# Patient Record
Sex: Female | Born: 1945 | Race: White | Hispanic: No | Smoking: Current every day smoker
Health system: Southern US, Community
[De-identification: ages and names within clinical notes are randomized; demographics above are authoritative.]

## PROBLEM LIST (undated history)

## (undated) DIAGNOSIS — Z9889 Other specified postprocedural states: Secondary | ICD-10-CM

## (undated) DIAGNOSIS — J449 Chronic obstructive pulmonary disease, unspecified: Secondary | ICD-10-CM

## (undated) DIAGNOSIS — K469 Unspecified abdominal hernia without obstruction or gangrene: Secondary | ICD-10-CM

## (undated) DIAGNOSIS — F419 Anxiety disorder, unspecified: Secondary | ICD-10-CM

## (undated) DIAGNOSIS — I10 Essential (primary) hypertension: Secondary | ICD-10-CM

## (undated) DIAGNOSIS — N3090 Cystitis, unspecified without hematuria: Secondary | ICD-10-CM

## (undated) DIAGNOSIS — N83209 Unspecified ovarian cyst, unspecified side: Secondary | ICD-10-CM

## (undated) DIAGNOSIS — E7800 Pure hypercholesterolemia, unspecified: Secondary | ICD-10-CM

## (undated) DIAGNOSIS — Z6791 Unspecified blood type, Rh negative: Secondary | ICD-10-CM

## (undated) DIAGNOSIS — K219 Gastro-esophageal reflux disease without esophagitis: Secondary | ICD-10-CM

## (undated) DIAGNOSIS — C50919 Malignant neoplasm of unspecified site of unspecified female breast: Secondary | ICD-10-CM

## (undated) DIAGNOSIS — N898 Other specified noninflammatory disorders of vagina: Secondary | ICD-10-CM

## (undated) DIAGNOSIS — Z8619 Personal history of other infectious and parasitic diseases: Secondary | ICD-10-CM

## (undated) DIAGNOSIS — R112 Nausea with vomiting, unspecified: Secondary | ICD-10-CM

## (undated) HISTORY — PX: TRIGGER FINGER RELEASE: SHX641

## (undated) HISTORY — DX: Cystitis, unspecified without hematuria: N30.90

## (undated) HISTORY — DX: Other specified noninflammatory disorders of vagina: N89.8

## (undated) HISTORY — DX: Essential (primary) hypertension: I10

## (undated) HISTORY — PX: NASAL SINUS SURGERY: SHX719

## (undated) HISTORY — DX: Pure hypercholesterolemia, unspecified: E78.00

## (undated) HISTORY — DX: Personal history of other infectious and parasitic diseases: Z86.19

## (undated) HISTORY — PX: EYE SURGERY: SHX253

## (undated) HISTORY — DX: Unspecified abdominal hernia without obstruction or gangrene: K46.9

## (undated) HISTORY — DX: Unspecified blood type, rh negative: Z67.91

## (undated) HISTORY — DX: Unspecified ovarian cyst, unspecified side: N83.209

## (undated) HISTORY — DX: Chronic obstructive pulmonary disease, unspecified: J44.9

## (undated) HISTORY — PX: HEMORRHOID SURGERY: SHX153

## (undated) HISTORY — DX: Malignant neoplasm of unspecified site of unspecified female breast: C50.919

## (undated) HISTORY — DX: Anxiety disorder, unspecified: F41.9

---

## 1998-05-17 ENCOUNTER — Other Ambulatory Visit: Admission: RE | Admit: 1998-05-17 | Discharge: 1998-05-17 | Payer: Self-pay | Admitting: Obstetrics and Gynecology

## 2004-04-21 ENCOUNTER — Other Ambulatory Visit: Admission: RE | Admit: 2004-04-21 | Discharge: 2004-04-21 | Payer: Self-pay | Admitting: Obstetrics and Gynecology

## 2004-05-16 ENCOUNTER — Encounter: Admission: RE | Admit: 2004-05-16 | Discharge: 2004-05-16 | Payer: Self-pay | Admitting: Obstetrics and Gynecology

## 2005-12-19 ENCOUNTER — Encounter: Admission: RE | Admit: 2005-12-19 | Discharge: 2005-12-19 | Payer: Self-pay | Admitting: Obstetrics and Gynecology

## 2007-05-29 ENCOUNTER — Encounter: Admission: RE | Admit: 2007-05-29 | Discharge: 2007-05-29 | Payer: Self-pay | Admitting: Obstetrics and Gynecology

## 2008-06-12 ENCOUNTER — Encounter: Admission: RE | Admit: 2008-06-12 | Discharge: 2008-06-12 | Payer: Self-pay | Admitting: Obstetrics and Gynecology

## 2008-06-18 ENCOUNTER — Encounter: Admission: RE | Admit: 2008-06-18 | Discharge: 2008-06-18 | Payer: Self-pay | Admitting: Obstetrics and Gynecology

## 2010-04-03 ENCOUNTER — Encounter: Payer: Self-pay | Admitting: Obstetrics and Gynecology

## 2010-07-14 ENCOUNTER — Other Ambulatory Visit: Payer: Self-pay | Admitting: Obstetrics and Gynecology

## 2010-07-14 DIAGNOSIS — Z1231 Encounter for screening mammogram for malignant neoplasm of breast: Secondary | ICD-10-CM

## 2010-07-25 ENCOUNTER — Ambulatory Visit
Admission: RE | Admit: 2010-07-25 | Discharge: 2010-07-25 | Disposition: A | Discharge status: Home / Self Care | Payer: Medicare Other | Source: Ambulatory Visit | Attending: Obstetrics and Gynecology | Admitting: Obstetrics and Gynecology

## 2010-07-25 DIAGNOSIS — Z1231 Encounter for screening mammogram for malignant neoplasm of breast: Secondary | ICD-10-CM

## 2011-06-27 ENCOUNTER — Other Ambulatory Visit: Payer: Self-pay | Admitting: Obstetrics and Gynecology

## 2011-06-27 DIAGNOSIS — Z1231 Encounter for screening mammogram for malignant neoplasm of breast: Secondary | ICD-10-CM

## 2011-07-27 ENCOUNTER — Ambulatory Visit: Payer: Medicare Other

## 2011-10-09 ENCOUNTER — Ambulatory Visit: Payer: Medicare Other

## 2011-10-13 ENCOUNTER — Ambulatory Visit: Payer: Medicare Other

## 2011-10-27 ENCOUNTER — Ambulatory Visit
Admission: RE | Admit: 2011-10-27 | Discharge: 2011-10-27 | Disposition: A | Discharge status: Home / Self Care | Payer: Medicare Other | Source: Ambulatory Visit | Attending: Obstetrics and Gynecology | Admitting: Obstetrics and Gynecology

## 2011-10-27 DIAGNOSIS — Z1231 Encounter for screening mammogram for malignant neoplasm of breast: Secondary | ICD-10-CM

## 2011-11-20 ENCOUNTER — Encounter: Payer: Self-pay | Admitting: Obstetrics and Gynecology

## 2011-11-20 ENCOUNTER — Other Ambulatory Visit: Payer: Medicare Other

## 2011-11-20 ENCOUNTER — Ambulatory Visit (INDEPENDENT_AMBULATORY_CARE_PROVIDER_SITE_OTHER): Payer: Medicare Other | Admitting: Obstetrics and Gynecology

## 2011-11-20 VITALS — BP 132/72 | Ht 63.5 in | Wt 133.0 lb

## 2011-11-20 DIAGNOSIS — Z124 Encounter for screening for malignant neoplasm of cervix: Secondary | ICD-10-CM

## 2011-11-20 DIAGNOSIS — Z13820 Encounter for screening for osteoporosis: Secondary | ICD-10-CM

## 2011-11-20 DIAGNOSIS — F419 Anxiety disorder, unspecified: Secondary | ICD-10-CM | POA: Insufficient documentation

## 2011-11-20 DIAGNOSIS — Z6791 Unspecified blood type, Rh negative: Secondary | ICD-10-CM | POA: Insufficient documentation

## 2011-11-20 DIAGNOSIS — N898 Other specified noninflammatory disorders of vagina: Secondary | ICD-10-CM | POA: Insufficient documentation

## 2011-11-20 DIAGNOSIS — K469 Unspecified abdominal hernia without obstruction or gangrene: Secondary | ICD-10-CM | POA: Insufficient documentation

## 2011-11-20 DIAGNOSIS — N3090 Cystitis, unspecified without hematuria: Secondary | ICD-10-CM | POA: Insufficient documentation

## 2011-11-20 DIAGNOSIS — Z01419 Encounter for gynecological examination (general) (routine) without abnormal findings: Secondary | ICD-10-CM

## 2011-11-20 DIAGNOSIS — M810 Age-related osteoporosis without current pathological fracture: Secondary | ICD-10-CM

## 2011-11-20 DIAGNOSIS — J449 Chronic obstructive pulmonary disease, unspecified: Secondary | ICD-10-CM | POA: Insufficient documentation

## 2011-11-20 DIAGNOSIS — E7800 Pure hypercholesterolemia, unspecified: Secondary | ICD-10-CM | POA: Insufficient documentation

## 2011-11-20 DIAGNOSIS — N83209 Unspecified ovarian cyst, unspecified side: Secondary | ICD-10-CM | POA: Insufficient documentation

## 2011-11-20 NOTE — Progress Notes (Signed)
The patient is not taking hormone replacement therapy The patient  is not taking a Calcium supplement. Post-menopausal bleeding:no  Last Pap: approximate date 12/31/2008 and was normal  Last mammogram: approximate date 10/27/2011 and was normal  Last DEXA scan : T= -2.3    2006 Last colonoscopy:n/a Has an upcoming appointment with Dr Kenna Gilbert partner  Urinary symptoms: none Normal bowel movements: No: constipation Reports abuse at home: No:   Subjective:    Amy Lucas is a 66 y.o. female G1P1 who presents for annual exam.  The patient has no complaints today.   The following portions of the patient's history were reviewed and updated as appropriate: allergies, current medications, past family history, past medical history, past social history, past surgical history and problem list.  Review of Systems Pertinent items are noted in HPI. Gastrointestinal:No change in bowel habits, no abdominal pain, no rectal bleeding Genitourinary:negative for dysuria, frequency, hematuria, nocturia and urinary incontinence    Objective:     Ht 5' 3.5" (1.613 m)  Wt 133 lb (60.328 kg)  BMI 23.19 kg/m2  Weight:  Wt Readings from Last 1 Encounters:  11/20/11 133 lb (60.328 kg)     BMI: Body mass index is 23.19 kg/(m^2). General Appearance: Alert, appropriate appearance for age. No acute distress HEENT: Grossly normal Neck / Thyroid: Supple, no masses, nodes or enlargement Lungs: clear to auscultation bilaterally Back: No CVA tenderness Breast Exam: No masses or nodes.No dimpling, nipple retraction or discharge. Cardiovascular: Regular rate and rhythm. S1, S2, no murmur Gastrointestinal: Soft, non-tender, no masses or organomegaly Pelvic Exam: Vulva and vagina appear normal. Bimanual exam reveals normal uterus and adnexa. Rectovaginal: normal rectal, no masses Lymphatic Exam: Non-palpable nodes in neck, clavicular, axillary, or inguinal regions Skin: no rash or abnormalities Neurologic:  Normal gait and speech, no tremor  Psychiatric: Alert and oriented, appropriate affect.     Assessment:    Normal gyn exam    Plan:   mammogram pap smear return annually or prn DEXA: osteoporosis. Pt to have Vitamin D level and follow-up visit to review options Follow-up:  for annual exam Calcium info given

## 2011-11-20 NOTE — Patient Instructions (Signed)
Patient Education Materials to be provided at check out (*indicates is located in Garment/textile technologist):  Menopause Years, *Osteoporosis, *Calcium Recommendations

## 2011-11-21 ENCOUNTER — Telehealth: Payer: Self-pay

## 2011-11-21 NOTE — Telephone Encounter (Signed)
LMTC at 11:00. Pt needs to return to office for Vit D level.  Also needs to f/u with SR for dx of osteoporosis.  ld

## 2011-11-22 LAB — PAP IG AND CT-NG NAA
Chlamydia Probe Amp: NEGATIVE
GC Probe Amp: NEGATIVE

## 2011-12-08 ENCOUNTER — Other Ambulatory Visit: Payer: Self-pay

## 2011-12-08 ENCOUNTER — Ambulatory Visit (INDEPENDENT_AMBULATORY_CARE_PROVIDER_SITE_OTHER): Payer: Medicare Other

## 2011-12-08 DIAGNOSIS — M810 Age-related osteoporosis without current pathological fracture: Secondary | ICD-10-CM

## 2012-09-05 ENCOUNTER — Other Ambulatory Visit: Payer: Self-pay

## 2012-09-05 DIAGNOSIS — Z1231 Encounter for screening mammogram for malignant neoplasm of breast: Secondary | ICD-10-CM

## 2012-10-28 ENCOUNTER — Ambulatory Visit: Payer: Medicare Other

## 2012-11-18 ENCOUNTER — Ambulatory Visit
Admission: RE | Admit: 2012-11-18 | Discharge: 2012-11-18 | Disposition: A | Discharge status: Home / Self Care | Payer: Medicare Other | Source: Ambulatory Visit

## 2012-11-18 DIAGNOSIS — Z1231 Encounter for screening mammogram for malignant neoplasm of breast: Secondary | ICD-10-CM

## 2012-11-20 ENCOUNTER — Other Ambulatory Visit: Payer: Self-pay | Admitting: Obstetrics and Gynecology

## 2012-11-20 DIAGNOSIS — R928 Other abnormal and inconclusive findings on diagnostic imaging of breast: Secondary | ICD-10-CM

## 2012-12-04 ENCOUNTER — Ambulatory Visit
Admission: RE | Admit: 2012-12-04 | Discharge: 2012-12-04 | Disposition: A | Discharge status: Home / Self Care | Payer: Medicare Other | Source: Ambulatory Visit | Attending: Obstetrics and Gynecology | Admitting: Obstetrics and Gynecology

## 2012-12-04 DIAGNOSIS — R928 Other abnormal and inconclusive findings on diagnostic imaging of breast: Secondary | ICD-10-CM

## 2013-09-25 ENCOUNTER — Other Ambulatory Visit: Payer: Self-pay | Admitting: Obstetrics and Gynecology

## 2013-09-25 ENCOUNTER — Other Ambulatory Visit: Payer: Self-pay

## 2013-09-25 DIAGNOSIS — Z1231 Encounter for screening mammogram for malignant neoplasm of breast: Secondary | ICD-10-CM

## 2013-11-20 ENCOUNTER — Ambulatory Visit: Payer: Medicare Other

## 2013-12-03 ENCOUNTER — Ambulatory Visit
Admission: RE | Admit: 2013-12-03 | Discharge: 2013-12-03 | Disposition: A | Discharge status: Home / Self Care | Payer: Medicare Other | Source: Ambulatory Visit | Attending: Obstetrics and Gynecology | Admitting: Obstetrics and Gynecology

## 2013-12-03 DIAGNOSIS — Z1231 Encounter for screening mammogram for malignant neoplasm of breast: Secondary | ICD-10-CM

## 2014-01-12 ENCOUNTER — Encounter: Payer: Self-pay | Admitting: Obstetrics and Gynecology

## 2015-04-30 DIAGNOSIS — E539 Vitamin B deficiency, unspecified: Secondary | ICD-10-CM | POA: Insufficient documentation

## 2015-04-30 DIAGNOSIS — F339 Major depressive disorder, recurrent, unspecified: Secondary | ICD-10-CM | POA: Insufficient documentation

## 2016-05-05 DIAGNOSIS — J3089 Other allergic rhinitis: Secondary | ICD-10-CM | POA: Insufficient documentation

## 2016-12-22 DIAGNOSIS — K219 Gastro-esophageal reflux disease without esophagitis: Secondary | ICD-10-CM | POA: Insufficient documentation

## 2017-03-15 ENCOUNTER — Ambulatory Visit: Payer: Self-pay | Admitting: Cardiology

## 2017-03-15 ENCOUNTER — Ambulatory Visit: Payer: Self-pay | Admitting: Cardiovascular Disease

## 2017-03-15 NOTE — Progress Notes (Deleted)
Cardiology Office Note   Date:  03/15/2017   ID:  Amy Lucas, DOB 1945-10-29, MRN [id number]  PCP:  Gordan Payment., MD  Cardiologist:   Chilton Si, MD   No chief complaint on file.     History of Present Illness: Amy Lucas is a 72 y.o. female with hyperlipidemia, tobacco abuse, and COPD, who presents for pre-surgical risk assessment. She saw Loann Quill, FNP on 12/31 for pre-op assessment prior to GYN surgery under anesthesia.  She has an endometrial polyp.  At that appointment she had an EKG that showed sinus tachycardia and PACs.  It was felt that she should be seen by cardiology prior to surgery.    Past Medical History:  Diagnosis Date  . Anxiety   . Bladder infection   . Blood type, Rh negative   . COPD (chronic obstructive pulmonary disease)   . Hernia   . High cholesterol   . History of chicken pox   . History of measles   . Ovarian cyst   . Vaginal dryness     Past Surgical History:  Procedure Laterality Date  . HEMORRHOID SURGERY       Current Outpatient Medications  Medication Sig Dispense Refill  . albuterol (PROAIR HFA) 108 (90 Base) MCG/ACT inhaler INHALE 2 PUFFS BY MOUTH EVERY 4 HOURS AS NEEDED    . ALPRAZolam (XANAX) 1 MG tablet Take 1 mg by mouth at bedtime as needed.    . AMBULATORY NON FORMULARY MEDICATION Medication Name: Cholesterol med. Pt unsure of name.    . diclofenac sodium (VOLTAREN) 1 % GEL Apply bid as needed to painful joints 1gm    . Fluticasone-Salmeterol (ADVAIR) 250-50 MCG/DOSE AEPB Inhale into the lungs.    . ibandronate (BONIVA) 150 MG tablet TK 1 T PO Q MONTH    . levofloxacin (LEVAQUIN) 750 MG tablet TK 1 T PO QD FOR 7 DAYS    . loratadine-pseudoephedrine (CLARITIN-D 24 HOUR) 10-240 MG 24 hr tablet TK 1 T PO QD    . omeprazole (PRILOSEC) 40 MG capsule TK 1 C PO 20 MIN B BRE    . polyethylene glycol powder (GLYCOLAX/MIRALAX) powder     . ranitidine (ZANTAC) 150 MG capsule Take 150 mg by mouth 2 (two) times daily.     . simvastatin (ZOCOR) 40 MG tablet TK 1 T PO QHS    . Wheat Dextrin (BENEFIBER PO) Take by mouth.     No current facility-administered medications for this visit.     Allergies:   Sulfa antibiotics and Triamcinolone acetonide    Social History:  The patient  reports that she has been smoking cigarettes.  She has been smoking about 1.00 pack per day. she has never used smokeless tobacco. She reports that she drinks alcohol. She reports that she does not use drugs.   Family History:  The patient's ***family history includes Cancer in her maternal aunt and maternal grandmother; Heart disease in her mother; Hypertension in her mother.    ROS:  Please see the history of present illness.   Otherwise, review of systems are positive for {NONE DEFAULTED:18576::"none"}.   All other systems are reviewed and negative.    PHYSICAL EXAM: VS:  There were no vitals taken for this visit. , BMI There is no height or weight on file to calculate BMI. GENERAL:  Well appearing HEENT:  Pupils equal round and reactive, fundi not visualized, oral mucosa unremarkable NECK:  No jugular venous distention, waveform within normal limits,  carotid upstroke brisk and symmetric, no bruits, no thyromegaly LYMPHATICS:  No cervical adenopathy LUNGS:  Clear to auscultation bilaterally HEART:  RRR.  PMI not displaced or sustained,S1 and S2 within normal limits, no S3, no S4, no clicks, no rubs, *** murmurs ABD:  Flat, positive bowel sounds normal in frequency in pitch, no bruits, no rebound, no guarding, no midline pulsatile mass, no hepatomegaly, no splenomegaly EXT:  2 plus pulses throughout, no edema, no cyanosis no clubbing SKIN:  No rashes no nodules NEURO:  Cranial nerves II through XII grossly intact, motor grossly intact throughout PSYCH:  Cognitively intact, oriented to person place and time    EKG:  EKG {ACTION; IS/IS QIO:96295284} ordered today. The ekg ordered today demonstrates ***   Recent Labs: No  results found for requested labs within last 8760 hours.    Lipid Panel No results found for: CHOL, TRIG, HDL, CHOLHDL, VLDL, LDLCALC, LDLDIRECT    Wt Readings from Last 3 Encounters:  11/20/11 133 lb (60.3 kg)      ASSESSMENT AND PLAN:  ***   Current medicines are reviewed at length with the patient today.  The patient {ACTIONS; HAS/DOES NOT HAVE:19233} concerns regarding medicines.  The following changes have been made:  {PLAN; NO CHANGE:13088:s}  Labs/ tests ordered today include: *** No orders of the defined types were placed in this encounter.    Disposition:   FU with ***    This note was written with the assistance of speech recognition software.  Please excuse any transcriptional errors.  Signed, Carlota Philley C. Duke Salvia, MD, [facility]  03/15/2017 1:05 PM    [facility]

## 2017-03-19 ENCOUNTER — Ambulatory Visit: Payer: Self-pay | Admitting: Cardiology

## 2017-04-02 ENCOUNTER — Ambulatory Visit: Payer: Medicare Other | Admitting: Cardiology

## 2017-04-02 ENCOUNTER — Encounter (INDEPENDENT_AMBULATORY_CARE_PROVIDER_SITE_OTHER): Payer: Self-pay

## 2017-04-02 ENCOUNTER — Encounter: Payer: Self-pay | Admitting: Cardiology

## 2017-04-02 VITALS — BP 120/74 | HR 114 | Ht 62.0 in | Wt 143.0 lb

## 2017-04-02 DIAGNOSIS — E7800 Pure hypercholesterolemia, unspecified: Secondary | ICD-10-CM | POA: Diagnosis not present

## 2017-04-02 DIAGNOSIS — F419 Anxiety disorder, unspecified: Secondary | ICD-10-CM | POA: Diagnosis not present

## 2017-04-02 DIAGNOSIS — J431 Panlobular emphysema: Secondary | ICD-10-CM | POA: Diagnosis not present

## 2017-04-02 DIAGNOSIS — I471 Supraventricular tachycardia: Secondary | ICD-10-CM | POA: Insufficient documentation

## 2017-04-02 NOTE — Patient Instructions (Signed)
Medication Instructions:  Your physician recommends that you continue on your current medications as directed. Please refer to the Current Medication list given to you today.  Labwork: None ordered  Testing/Procedures: Your physician has requested that you have an echocardiogram. Echocardiography is a painless test that uses sound waves to create images of your heart. It provides your doctor with information about the size and shape of your heart and how well your heart's chambers and valves are working. This procedure takes approximately one hour. There are no restrictions for this procedure.  Your physician has recommended that you wear a holter monitor. Holter monitors are medical devices that record the heart's electrical activity. Doctors most often use these monitors to diagnose arrhythmias. Arrhythmias are problems with the speed or rhythm of the heartbeat. The monitor is a small, portable device. You can wear one while you do your normal daily activities. This is usually used to diagnose what is causing palpitations/syncope (passing out). You will wear this for 24 hours. Be prepared to return to the office to have it taken off.  Follow-Up: Your physician recommends that you schedule a follow-up appointment in: 1 month with Dr. Bing MatterKrasowski  Any Other Special Instructions Will Be Listed Below (If Applicable).     If you need a refill on your cardiac medications before your next appointment, please call your pharmacy.

## 2017-04-02 NOTE — Progress Notes (Signed)
Cardiology Consultation:    Date:  04/02/2017   ID:  Amy Lucas, DOB January 23, 1946, MRN [id number]  PCP:  Gordan Payment., MD  Cardiologist:  Gypsy Balsam, MD   Referring MD: Gordan Payment., MD   Chief Complaint  Patient presents with  . Pre-op Exam  . Abnormal ECG  I need surgery  History of Present Illness:    Amy Lucas is a 72 y.o. female who is being seen today for the evaluation of supraventricular tachycardia at the request of Gordan Payment., MD.  She is a 72 years old woman who is a chronic smoker required gynecological intervention.  I understand that that will be done off under sedation with Diprivan.  She did have a EKG done as part of her routine preop evaluation she was find to have some runs of supraventricular tachycardia she was referred to [place] for evaluation.  Amy Lucas is completely unaware of it.  Denies have any palpitations dizziness syncope chest pain tightness squeezing pressure burning chest.  She is very energetic she works she has no difficulty doing it.  There is no swelling of lower extremities no proximal nocturnal dyspnea.   Past Medical History:  Diagnosis Date  . Anxiety   . Bladder infection   . Blood type, Rh negative   . COPD (chronic obstructive pulmonary disease) (HCC)   . Hernia   . High cholesterol   . History of chicken pox   . History of measles   . Ovarian cyst   . Vaginal dryness     Past Surgical History:  Procedure Laterality Date  . HEMORRHOID SURGERY      Current Medications: Current Meds  Medication Sig  . albuterol (PROAIR HFA) 108 (90 Base) MCG/ACT inhaler INHALE 2 PUFFS BY MOUTH EVERY 4 HOURS AS NEEDED  . ALPRAZolam (XANAX) 1 MG tablet Take 1 mg by mouth at bedtime as needed.  . diclofenac sodium (VOLTAREN) 1 % GEL Apply bid as needed to painful joints 1gm  . Fluticasone-Salmeterol (ADVAIR) 250-50 MCG/DOSE AEPB Inhale into the lungs.  . ibandronate (BONIVA) 150 MG tablet TK 1 T PO Q MONTH  .  loratadine-pseudoephedrine (CLARITIN-D 24 HOUR) 10-240 MG 24 hr tablet TK 1 T PO QD  . omeprazole (PRILOSEC) 40 MG capsule TK 1 C PO 20 MIN B BRE  . polyethylene glycol powder (GLYCOLAX/MIRALAX) powder   . simvastatin (ZOCOR) 40 MG tablet TK 1 T PO QHS     Allergies:   Sulfa antibiotics and Triamcinolone acetonide   Social History   Socioeconomic History  . Marital status: Married    Spouse name: None  . Number of children: None  . Years of education: None  . Highest education level: None  Social Needs  . Financial resource strain: None  . Food insecurity - worry: None  . Food insecurity - inability: None  . Transportation needs - medical: None  . Transportation needs - non-medical: None  Occupational History  . None  Tobacco Use  . Smoking status: Current Every Day Smoker    Packs/day: 1.00    Types: Cigarettes  . Smokeless tobacco: Never Used  Substance and Sexual Activity  . Alcohol use: Yes  . Drug use: No  . Sexual activity: Yes    Birth control/protection: Post-menopausal  Other Topics Concern  . None  Social History Narrative  . None     Family History: The patient's family history includes Cancer in her maternal aunt and maternal grandmother; Heart disease in  her mother; Heart failure in her mother; Hypertension in her mother. ROS:   Please see the history of present illness.    All 14 point review of systems negative except as described per history of present illness.  EKGs/Labs/Other Studies Reviewed:    The following studies were reviewed today: KG from primary care office showed normal sinus rhythm normal P interval no microscopic duration morphology there is a round of SVT at rate of 160: No ST segment changes    Recent Labs: No results found for requested labs within last 8760 hours.  Recent Lipid Panel No results found for: CHOL, TRIG, HDL, CHOLHDL, VLDL, LDLCALC, LDLDIRECT  Physical Exam:    VS:  BP 120/74   Pulse (!) 114   Ht 5\' 2"  (1.575  m)   Wt 143 lb (64.9 kg)   SpO2 98%   BMI 26.16 kg/m     Wt Readings from Last 3 Encounters:  04/02/17 143 lb (64.9 kg)  11/20/11 133 lb (60.3 kg)     GEN:  Well nourished, well developed in no acute distress HEENT: Normal NECK: No JVD; No carotid bruits LYMPHATICS: No lymphadenopathy CARDIAC: RRR, no murmurs, no rubs, no gallops RESPIRATORY:  Clear to auscultation without rales, wheezing or rhonchi  ABDOMEN: Soft, non-tender, non-distended MUSCULOSKELETAL:  No edema; No deformity  SKIN: Warm and dry NEUROLOGIC:  Alert and oriented x 3 PSYCHIATRIC:  Normal affect   ASSESSMENT:    1. Panlobular emphysema (HCC)   2. Anxiety   3. High cholesterol    PLAN:    In order of problems listed above:  1. Preop evaluation for this lady with COPD and abnormal EKG.  Overall her surgery is considered low risk.  From cardiac standpoint reviewed the anesthesia that she will have is also considered low risk.  The fact that she has supraventricular tachycardia need to be investigated.  I will ask her to wear Holter monitor to see how much arrhythmia she does have.  I will also ask her to have an echocardiogram to assess her left ventricular ejection fraction.  Since she does have quite advanced COPD echocardiogram will also help to assess right ventricle size and function.  I will be also interested in pulmonary pressure. 2. High cholesterol: I do not see any results of her cholesterol within last year we will do the test. 3. Smoking: We spent at least 5 minutes talking about it and I strongly recommended her to quit.  She understands she will try 4. Diet: Noted.  LAD for a minor gynecological intervention EKG showing some supraventricular tachycardia we will do some basic evaluation but honestly I do not anticipate this to interfere with the surgery that she is going to have.  She may require small dose of beta-blocker and surgical time to suppress this arrhythmia if she develops  one.   Medication Adjustments/Labs and Tests Ordered: Current medicines are reviewed at length with the patient today.  Concerns regarding medicines are outlined above.  No orders of the defined types were placed in this encounter.  No orders of the defined types were placed in this encounter.   Signed, Georgeanna Lea, MD, [facility]. 04/02/2017 2:46 PM    [facility]

## 2017-04-03 ENCOUNTER — Ambulatory Visit: Payer: Medicare Other

## 2017-04-03 DIAGNOSIS — I480 Paroxysmal atrial fibrillation: Secondary | ICD-10-CM | POA: Diagnosis not present

## 2017-04-04 ENCOUNTER — Ambulatory Visit (HOSPITAL_COMMUNITY): Payer: Medicare Other | Attending: Cardiovascular Disease

## 2017-04-04 ENCOUNTER — Other Ambulatory Visit: Payer: Self-pay

## 2017-04-04 DIAGNOSIS — I5189 Other ill-defined heart diseases: Secondary | ICD-10-CM | POA: Insufficient documentation

## 2017-04-04 DIAGNOSIS — E7800 Pure hypercholesterolemia, unspecified: Secondary | ICD-10-CM

## 2017-04-04 DIAGNOSIS — J431 Panlobular emphysema: Secondary | ICD-10-CM | POA: Diagnosis not present

## 2017-04-04 DIAGNOSIS — R9431 Abnormal electrocardiogram [ECG] [EKG]: Secondary | ICD-10-CM | POA: Insufficient documentation

## 2017-04-04 DIAGNOSIS — Z01810 Encounter for preprocedural cardiovascular examination: Secondary | ICD-10-CM | POA: Insufficient documentation

## 2017-04-04 DIAGNOSIS — F419 Anxiety disorder, unspecified: Secondary | ICD-10-CM

## 2017-04-06 ENCOUNTER — Telehealth: Payer: Self-pay | Admitting: Cardiology

## 2017-04-06 NOTE — Telephone Encounter (Signed)
Wants echo and monitor results

## 2017-04-09 NOTE — Telephone Encounter (Signed)
Echo looks fine, holter - APC's, will talk about it during next visit

## 2017-04-10 ENCOUNTER — Telehealth: Payer: Self-pay | Admitting: Cardiology

## 2017-04-10 NOTE — Telephone Encounter (Signed)
Patient informed of results of holter monitor. No further questions.

## 2017-04-10 NOTE — Telephone Encounter (Signed)
Returned your call.

## 2017-04-11 NOTE — Telephone Encounter (Signed)
Patient has been advised of results.  

## 2017-05-03 ENCOUNTER — Ambulatory Visit: Payer: Medicare Other | Admitting: Cardiology

## 2017-05-03 ENCOUNTER — Encounter: Payer: Self-pay | Admitting: Cardiology

## 2017-05-03 VITALS — BP 154/86 | HR 104 | Ht 62.0 in | Wt 144.0 lb

## 2017-05-03 DIAGNOSIS — I471 Supraventricular tachycardia: Secondary | ICD-10-CM | POA: Diagnosis not present

## 2017-05-03 DIAGNOSIS — J431 Panlobular emphysema: Secondary | ICD-10-CM | POA: Diagnosis not present

## 2017-05-03 DIAGNOSIS — R0789 Other chest pain: Secondary | ICD-10-CM | POA: Diagnosis not present

## 2017-05-03 NOTE — Progress Notes (Signed)
Cardiology Office Note:    Date:  05/03/2017   ID:  Amy Lucas, DOB 12/18/1945, MRN [id number]  PCP:  Gordan Payment., MD  Cardiologist:  Gypsy Balsam, MD    Referring MD: Gordan Payment., MD   Chief Complaint  Patient presents with  . Follow-up  I had a chest pain this morning  History of Present Illness:    Amy Lucas is a 72 y.o. female with COPD as well as supraventricular tachycardia.  I saw her first time for evaluation of her tachyarrhythmia so far and her workup has been negative.  She did have gynecological surgery which was done under the prevent.  She did well.  This morning she woke up and she had pain in her neck going towards right shoulder and also going to the lower back she also had difficulty breathing she had to sit for a few minutes and use her inhaler to help her breathe and after that about 4 hours later pain subsided.  Now she is doing well.  Denies having any pain in her calves.  I will ask you to have troponin I as well as d-dimer done today.  Past Medical History:  Diagnosis Date  . Anxiety   . Bladder infection   . Blood type, Rh negative   . COPD (chronic obstructive pulmonary disease) (HCC)   . Hernia   . High cholesterol   . History of chicken pox   . History of measles   . Ovarian cyst   . Vaginal dryness     Past Surgical History:  Procedure Laterality Date  . HEMORRHOID SURGERY      Current Medications: Current Meds  Medication Sig  . albuterol (PROAIR HFA) 108 (90 Base) MCG/ACT inhaler INHALE 2 PUFFS BY MOUTH EVERY 4 HOURS AS NEEDED  . ALPRAZolam (XANAX) 1 MG tablet Take 1 mg by mouth at bedtime as needed for anxiety. Take 1-4 tablets daily as needed  . diclofenac sodium (VOLTAREN) 1 % GEL Apply bid as needed to painful joints 1gm  . Fluticasone-Salmeterol (ADVAIR) 250-50 MCG/DOSE AEPB Inhale into the lungs.  . ibandronate (BONIVA) 150 MG tablet TK 1 T PO Q MONTH  . loratadine-pseudoephedrine (CLARITIN-D 24 HOUR) 10-240 MG 24  hr tablet TK 1 T PO QD  . omeprazole (PRILOSEC) 40 MG capsule TK 1 C PO 20 MIN B BRE  . polyethylene glycol powder (GLYCOLAX/MIRALAX) powder   . simvastatin (ZOCOR) 40 MG tablet TK 1 T PO QHS     Allergies:   Sulfa antibiotics and Triamcinolone acetonide   Social History   Socioeconomic History  . Marital status: Married    Spouse name: None  . Number of children: None  . Years of education: None  . Highest education level: None  Social Needs  . Financial resource strain: None  . Food insecurity - worry: None  . Food insecurity - inability: None  . Transportation needs - medical: None  . Transportation needs - non-medical: None  Occupational History  . None  Tobacco Use  . Smoking status: Current Every Day Smoker    Packs/day: 1.00    Types: Cigarettes  . Smokeless tobacco: Never Used  Substance and Sexual Activity  . Alcohol use: Yes  . Drug use: No  . Sexual activity: Yes    Birth control/protection: Post-menopausal  Other Topics Concern  . None  Social History Narrative  . None     Family History: The patient's family history includes Cancer in her maternal  aunt and maternal grandmother; Heart disease in her mother; Heart failure in her mother; Hypertension in her mother. ROS:   Please see the history of present illness.    All 14 point review of systems negative except as described per history of present illness  EKGs/Labs/Other Studies Reviewed:      Recent Labs: No results found for requested labs within last 8760 hours.  Recent Lipid Panel No results found for: CHOL, TRIG, HDL, CHOLHDL, VLDL, LDLCALC, LDLDIRECT  Physical Exam:    VS:  BP (!) 154/86 (BP Location: Right Arm, Patient Position: Sitting, Cuff Size: Normal)   Pulse (!) 104   Ht 5\' 2"  (1.575 m)   Wt 144 lb (65.3 kg)   SpO2 96%   BMI 26.34 kg/m     Wt Readings from Last 3 Encounters:  05/03/17 144 lb (65.3 kg)  04/02/17 143 lb (64.9 kg)  11/20/11 133 lb (60.3 kg)     GEN:  Well  nourished, well developed in no acute distress HEENT: Normal NECK: No JVD; No carotid bruits LYMPHATICS: No lymphadenopathy CARDIAC: RRR, no murmurs, no rubs, no gallops RESPIRATORY:  Clear to auscultation without rales, wheezing or rhonchi  ABDOMEN: Soft, non-tender, non-distended MUSCULOSKELETAL:  No edema; No deformity  SKIN: Warm and dry LOWER EXTREMITIES: no swelling NEUROLOGIC:  Alert and oriented x 3 PSYCHIATRIC:  Normal affect   ASSESSMENT:    1. Supraventricular tachycardia (HCC)   2. Panlobular emphysema (HCC)    PLAN:    In order of problems listed above:  1. Supraventricular tachycardia.  Denies having any palpitations I am reluctant to put her on beta-blocker because of her advanced COPD.  She still continues to smoke but much less than before.  We spent a great deal of time talking about needs to quit she understands that this is would need to be done. 2. Chest pain: D-dimer will be done as well as troponin I her EKG today show no acute changes. 3. COPD: Followed by internal medicine team.   Medication Adjustments/Labs and Tests Ordered: Current medicines are reviewed at length with the patient today.  Concerns regarding medicines are outlined above.  No orders of the defined types were placed in this encounter.  Medication changes: No orders of the defined types were placed in this encounter.   Signed, Georgeanna Lea, MD, [facility] 05/03/2017 4:37 PM    [facility]

## 2017-05-03 NOTE — Patient Instructions (Signed)
Medication Instructions:  Your physician recommends that you continue on your current medications as directed. Please refer to the Current Medication list given to you today.  Labwork: Your physician recommends that you have lab work have: Troponin and D-Dimer  Testing/Procedures: None ordered  Follow-Up: Your physician recommends that you schedule a follow-up appointment in: 6 months with [place]. [place]   Any Other Special Instructions Will Be Listed Below (If Applicable).     If you need a refill on your cardiac medications before your next appointment, please call your pharmacy.

## 2017-05-04 LAB — TROPONIN I: Troponin I: <0.01 ng/mL (ref 0.00–0.04)

## 2017-05-04 LAB — D-DIMER, QUANTITATIVE: D-DIMER: 0.43 mg/L FEU (ref 0.00–0.49)

## 2017-05-07 ENCOUNTER — Telehealth: Payer: Self-pay | Admitting: Cardiology

## 2017-05-07 NOTE — Telephone Encounter (Signed)
Pt notified of lab results per Dr Krasowski.  Pt verbalized understanding. 

## 2017-05-07 NOTE — Telephone Encounter (Signed)
Patient would like her results please

## 2018-09-21 ENCOUNTER — Other Ambulatory Visit: Payer: Self-pay

## 2018-09-21 ENCOUNTER — Emergency Department (HOSPITAL_COMMUNITY)
Admission: EM | Admit: 2018-09-21 | Discharge: 2018-09-21 | Disposition: A | Discharge status: Home / Self Care | Payer: Medicare Other | Attending: Emergency Medicine | Admitting: Emergency Medicine

## 2018-09-21 ENCOUNTER — Encounter (HOSPITAL_COMMUNITY): Payer: Self-pay | Admitting: *Deleted

## 2018-09-21 ENCOUNTER — Emergency Department (HOSPITAL_COMMUNITY): Payer: Medicare Other

## 2018-09-21 DIAGNOSIS — I471 Supraventricular tachycardia: Secondary | ICD-10-CM | POA: Diagnosis not present

## 2018-09-21 DIAGNOSIS — Z79899 Other long term (current) drug therapy: Secondary | ICD-10-CM | POA: Insufficient documentation

## 2018-09-21 DIAGNOSIS — R42 Dizziness and giddiness: Secondary | ICD-10-CM | POA: Diagnosis present

## 2018-09-21 DIAGNOSIS — F17210 Nicotine dependence, cigarettes, uncomplicated: Secondary | ICD-10-CM | POA: Diagnosis not present

## 2018-09-21 DIAGNOSIS — J449 Chronic obstructive pulmonary disease, unspecified: Secondary | ICD-10-CM | POA: Diagnosis not present

## 2018-09-21 DIAGNOSIS — R110 Nausea: Secondary | ICD-10-CM | POA: Diagnosis not present

## 2018-09-21 LAB — CBC
HCT: 45.4 % (ref 36.0–46.0)
Hemoglobin: 14.6 g/dL (ref 12.0–15.0)
MCH: 30.1 pg (ref 26.0–34.0)
MCHC: 32.2 g/dL (ref 30.0–36.0)
MCV: 93.6 fL (ref 80.0–100.0)
Platelets: 213 10*3/uL (ref 150–400)
RBC: 4.85 MIL/uL (ref 3.87–5.11)
RDW: 12.8 % (ref 11.5–15.5)
WBC: 8.9 10*3/uL (ref 4.0–10.5)
nRBC: 0 % (ref 0.0–0.2)

## 2018-09-21 LAB — COMPREHENSIVE METABOLIC PANEL
ALT: 12 U/L (ref 0–44)
AST: 15 U/L (ref 15–41)
Albumin: 3.9 g/dL (ref 3.5–5.0)
Alkaline Phosphatase: 47 U/L (ref 38–126)
Anion gap: 11 (ref 5–15)
BUN: 12 mg/dL (ref 8–23)
CO2: 22 mmol/L (ref 22–32)
Calcium: 9.5 mg/dL (ref 8.9–10.3)
Chloride: 106 mmol/L (ref 98–111)
Creatinine, Ser: 0.84 mg/dL (ref 0.44–1.00)
GFR calc Af Amer: >60 mL/min (ref >60)
GFR calc non Af Amer: >60 mL/min (ref >60)
Glucose, Bld: 170 mg/dL — ABNORMAL HIGH (ref 70–99)
Potassium: 3.3 mmol/L — ABNORMAL LOW (ref 3.5–5.1)
Sodium: 139 mmol/L (ref 135–145)
Total Bilirubin: 0.5 mg/dL (ref 0.3–1.2)
Total Protein: 7.4 g/dL (ref 6.5–8.1)

## 2018-09-21 LAB — DIFFERENTIAL
Abs Immature Granulocytes: 0.02 10*3/uL (ref 0.00–0.07)
Basophils Absolute: 0 10*3/uL (ref 0.0–0.1)
Basophils Relative: 0 %
Eosinophils Absolute: 0.1 10*3/uL (ref 0.0–0.5)
Eosinophils Relative: 1 %
Immature Granulocytes: 0 %
Lymphocytes Relative: 27 %
Lymphs Abs: 2.4 10*3/uL (ref 0.7–4.0)
Monocytes Absolute: 0.5 10*3/uL (ref 0.1–1.0)
Monocytes Relative: 6 %
Neutro Abs: 5.9 10*3/uL (ref 1.7–7.7)
Neutrophils Relative %: 66 %

## 2018-09-21 LAB — PROTIME-INR
INR: 1.1 (ref 0.8–1.2)
Prothrombin Time: 13.7 seconds (ref 11.4–15.2)

## 2018-09-21 LAB — CBG MONITORING, ED: Glucose-Capillary: 77 mg/dL (ref 70–99)

## 2018-09-21 LAB — I-STAT CHEM 8, ED
BUN: 13 mg/dL (ref 8–23)
Calcium, Ion: 1.19 mmol/L (ref 1.15–1.40)
Chloride: 105 mmol/L (ref 98–111)
Creatinine, Ser: 0.8 mg/dL (ref 0.44–1.00)
Glucose, Bld: 166 mg/dL — ABNORMAL HIGH (ref 70–99)
HCT: 45 % (ref 36.0–46.0)
Hemoglobin: 15.3 g/dL — ABNORMAL HIGH (ref 12.0–15.0)
Potassium: 3.2 mmol/L — ABNORMAL LOW (ref 3.5–5.1)
Sodium: 140 mmol/L (ref 135–145)
TCO2: 24 mmol/L (ref 22–32)

## 2018-09-21 LAB — APTT: aPTT: 31 seconds (ref 24–36)

## 2018-09-21 MED ORDER — MECLIZINE HCL 25 MG PO TABS: 25.0000 mg | ORAL_TABLET | Freq: Three times a day (TID) | ORAL | 0 refills | Status: AC | PRN

## 2018-09-21 MED ORDER — MECLIZINE HCL 25 MG PO TABS
25.0000 mg | ORAL_TABLET | Freq: Once | ORAL | Status: AC
  Administered 2018-09-21: 25 mg via ORAL
  Filled 2018-09-21: qty 1

## 2018-09-21 MED ORDER — SODIUM CHLORIDE 0.9% FLUSH: 3.0000 mL | Freq: Once | INTRAVENOUS | Status: DC

## 2018-09-21 NOTE — ED Provider Notes (Signed)
[facility]   CSN: [id number] Arrival date & time: 09/21/18  1851     History   Chief Complaint Chief Complaint  Patient presents with   Dizziness    HPI Amy Lucas is a 73 y.o. female.     Patient is a 73 year old female with a history of COPD, high cholesterol and SVT who is presenting today with a complaint of dizziness.  Patient states that last night she fell asleep on the couch and when she got up to try to go to bed she felt very dizzy like the room was spinning and she started falling but she was able to catch herself.  She states she has had several episodes since last night and the room will spin and it makes her feel terrible.  It is related to certain movements mostly when she turns to her right or moves her head to the right.  When these episodes occur she feels weak all over but denies any loss of consciousness, feelings of lightheadedness, palpitations or chest pain.  She has had no headache, neck pain or visual changes last night or today.  No unilateral weakness numbness, facial drooping or speech difficulty.  She denies having symptoms at this moment but states when she was in the CAT scanner and when she turns her her left to get back onto the bed she started having symptoms again.  However they are now resolved  The history is provided by the patient.  Dizziness Quality:  Head spinning Severity:  Moderate Onset quality:  Sudden Duration:  1 day Timing:  Intermittent Progression:  Resolved Chronicity:  New Context comment:  Started last night when she stood up from the cough.   Relieved by:  Being still Worsened by:  Movement Ineffective treatments:  None tried Associated symptoms: nausea   Associated symptoms: no chest pain, no diarrhea, no headaches, no hearing loss, no vision changes and no vomiting   Associated symptoms comment:  When episodes happen she feels weak all over for a few minutes but  improves if she keeps walking Risk factors: no hx of vertigo and no new medications     Past Medical History:  Diagnosis Date   Anxiety    Bladder infection    Blood type, Rh negative    COPD (chronic obstructive pulmonary disease) (HCC)    Hernia    High cholesterol    History of chicken pox    History of measles    Ovarian cyst    Vaginal dryness     Patient Active Problem List   Diagnosis Date Noted   Atypical chest pain 05/03/2017   Supraventricular tachycardia (HCC) 04/02/2017   GERD (gastroesophageal reflux disease) 12/22/2016   Chronic nonseasonal allergic rhinitis due to pollen 05/05/2016   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 Authorizing Provider  albuterol (PROAIR HFA) 108 (90 Base) MCG/ACT inhaler INHALE 2 PUFFS BY MOUTH EVERY 4 HOURS AS NEEDED 03/02/17   [provider]  ALPRAZolam Prudy Feeler(XANAX) 1 MG tablet Take 1 mg by mouth at bedtime as needed for anxiety. Take 1-4 tablets daily as needed    [provider]  diclofenac sodium (VOLTAREN) 1 % GEL Apply bid as needed to painful joints 1gm 05/05/16   [provider]  Fluticasone-Salmeterol (ADVAIR) 250-50 MCG/DOSE AEPB Inhale into the lungs.    [provider]  ibandronate (BONIVA) 150 MG tablet TK 1 T PO Q MONTH 02/18/17   [provider]  loratadine-pseudoephedrine  (CLARITIN-D 24 HOUR) 10-240 MG 24 hr tablet TK 1 T PO QD 03/02/17   [provider]  omeprazole (PRILOSEC) 40 MG capsule TK 1 C PO 20 MIN B BRE 02/11/17   [provider]  polyethylene glycol powder (GLYCOLAX/MIRALAX) powder  12/23/16   [provider]  simvastatin (ZOCOR) 40 MG tablet TK 1 T PO QHS 02/12/17   [provider]    Family History Family History  Problem Relation Age of Onset   Cancer Maternal Grandmother    Heart disease Mother    Hypertension Mother    Heart failure Mother    Cancer Maternal Aunt     Social History Social History   Tobacco Use   Smoking status: Current Every Day Smoker    Packs/day: 1.00    Types: Cigarettes   Smokeless tobacco: Never Used  Substance Use Topics   Alcohol use: Yes   Drug use: No     Allergies   Sulfa antibiotics and Triamcinolone acetonide   Review of Systems Review of Systems  HENT: Negative for hearing loss.   Cardiovascular: Negative for chest pain.  Gastrointestinal: Positive for nausea. Negative for diarrhea and vomiting.  Neurological: Positive for dizziness. Negative for headaches.  All other systems reviewed and are negative.    Physical Exam Updated Vital Signs BP (!) 147/80 (BP Location: Right Arm)    Pulse 84    Temp 98.4 F (36.9 C) (Oral)    Resp 18    SpO2 96%   Physical Exam Vitals signs and nursing note reviewed.  Constitutional:      General: She is not in acute distress.    Appearance: She is well-developed.  HENT:     Head: Normocephalic and atraumatic.  Eyes:     General: No visual field deficit.    Pupils: Pupils are equal, round, and reactive to light.  Cardiovascular:     Rate and Rhythm: Normal rate and regular rhythm.     Heart sounds: Normal heart sounds. No murmur. No friction rub.  Pulmonary:     Effort: Pulmonary effort is normal.     Breath sounds: Normal breath sounds. No wheezing or rales.  Abdominal:     General: Bowel sounds are  normal. There is no distension.     Palpations: Abdomen is soft.     Tenderness: There is no abdominal tenderness. There is no guarding or rebound.  Musculoskeletal: Normal range of motion.        General: No tenderness.     Comments:  No edema  Skin:    General: Skin is warm and dry.     Findings: No rash.  Neurological:     General: No focal deficit present.     Mental Status: She is alert and oriented to person, place, and time. Mental status is at baseline.     Cranial Nerves: No cranial nerve deficit or facial asymmetry.     Sensory: Sensation is intact. No sensory deficit.     Motor: No weakness or pronator drift.     Coordination: Coordination normal. Finger-Nose-Finger Test and Heel to [place] Test normal.     Gait: Gait is intact.     Comments: No notable nystagmus  Psychiatric:        Behavior: Behavior normal.      ED Treatments / Results  Labs (all labs ordered are listed, but only abnormal results are displayed) Labs Reviewed  COMPREHENSIVE METABOLIC PANEL - Abnormal; Notable for the following components:      Result Value   Potassium 3.3 (*)    Glucose, Bld 170 (*)    All other components within normal limits  I-STAT CHEM 8, ED - Abnormal; Notable for the following components:   Potassium 3.2 (*)    Glucose, Bld 166 (*)    Hemoglobin 15.3 (*)    All other components within normal limits  PROTIME-INR  APTT  CBC  DIFFERENTIAL  CBG MONITORING, ED    EKG None  Radiology Ct Head Wo Contrast  Result Date: 09/21/2018 CLINICAL DATA:  Fall, dizzy spells EXAM: CT HEAD WITHOUT CONTRAST TECHNIQUE: Contiguous axial images were obtained from the base of the skull through the vertex without intravenous contrast. COMPARISON:  None. FINDINGS: Brain: No evidence of acute infarction, hemorrhage, hydrocephalus, extra-axial collection or mass lesion/mass effect. Subcortical white matter and periventricular small vessel ischemic changes. Vascular: No hyperdense vessel or  unexpected calcification. Skull: Normal. Negative for fracture or focal lesion. Sinuses/Orbits: Mild layering fluid in the right sphenoid sinus. Visualized paranasal sinuses and mastoid air cells are otherwise clear. Other: None. IMPRESSION: No evidence of acute intracranial abnormality. Mild small vessel ischemic changes. Electronically Signed   By: Julian Hy M.D.   On: 09/21/2018 20:29    Procedures Procedures (including critical care time)  Medications Ordered in ED Medications  sodium chloride flush (NS) 0.9 % injection 3 mL (has no administration in time range)  meclizine (ANTIVERT) tablet 25 mg (has no administration in time range)     Initial Impression / Assessment and Plan / ED Course  I have reviewed the triage vital signs and the nursing notes.  Pertinent labs & imaging results that were available during my care of the patient were reviewed by me and considered in my medical decision making (see chart for details).        Pt with sx most consistent with peripheral vertigo.  No systemic or infectious sx.  Normal neuro exam without weakness, ataxia or cerebellar findings on exam.  Normal vision.  Sx are reproducible with movement of the head and attempting to walk.  No hx of Stroke and low likelihood.  Normal VS.  Labs and CT done prior to being seen and CT neg for acute issues and labs with minimal hypokalemia of 3.2 but o/w labs wnl.  Patient is having no headache or neck pain suggestive of intracranial hemorrhage, subarachnoid hemorrhage, vertebral dissection.  Do not feel that patient needs an MRI at this time.  Will treat for peripheral vertigo and  Final Clinical Impressions(s) / ED Diagnoses   Final diagnoses:  Vertigo    ED Discharge Orders         Ordered    meclizine (ANTIVERT) 25 MG tablet  3 times daily PRN     09/21/18 2229           Gwyneth SproutPlunkett, Dereon Williamsen, MD 09/21/18 2229

## 2018-09-21 NOTE — ED Notes (Signed)
Patient successfully completed swallow screen with water. No choking/coughing noted.

## 2018-09-21 NOTE — ED Triage Notes (Signed)
Pt sent from UC for ongoing dizziness and a CT scan. Pt reports last night she was sitting on the couch and felt "like the room was wavy then I leaned to my R side." pt has had two other episodes that caused her daughter in law to bring her to be seen. 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 No neuro deficits at present

## 2018-09-21 NOTE — ED Notes (Signed)
Patient verbalizes understanding of discharge instructions. Opportunity for questioning and answers were provided. pt discharged from ED with family. Patient educated on new prescription as well.

## 2018-09-21 NOTE — Discharge Instructions (Signed)
You can take the meclizine as needed for the dizziness.  Make sure you are moving slowly and get up slowly and make sure you have something to hold onto so that you do not lose your balance.

## 2019-12-16 IMAGING — CT CT HEAD WITHOUT CONTRAST
4 series · 17 of 47 positions shown, 19 images · non-contrast
Comparison: None.

CLINICAL DATA: Fall, dizzy spells

EXAM:
CT HEAD WITHOUT CONTRAST
TECHNIQUE: Contiguous axial images were obtained from the base of the skull
through the vertex without intravenous contrast.

[Series 3: head wo · axial · 0.44mm/px · z∈[-154,-30]mm · 7 of 35 slices shown, 9 images]
[im 5/35  brain]
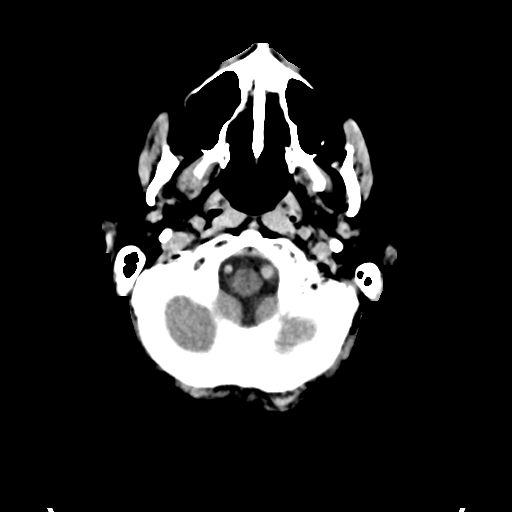
[im 5/35  bone]
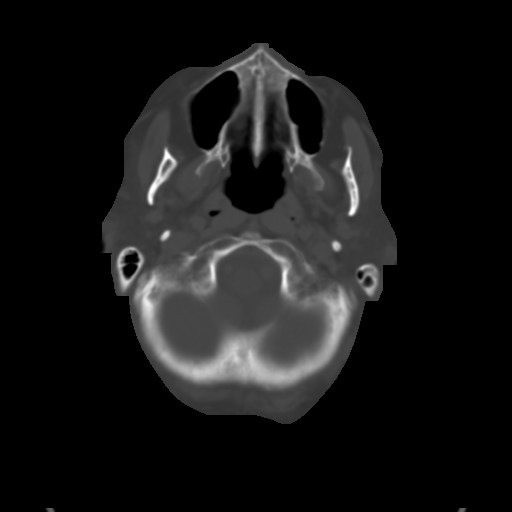
[im 9/35  brain]
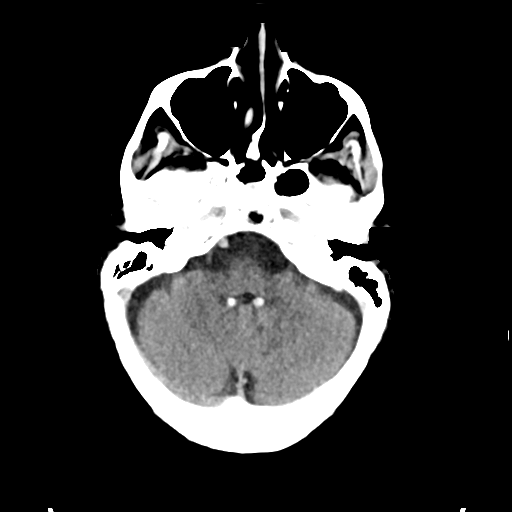
[im 13/35  brain]
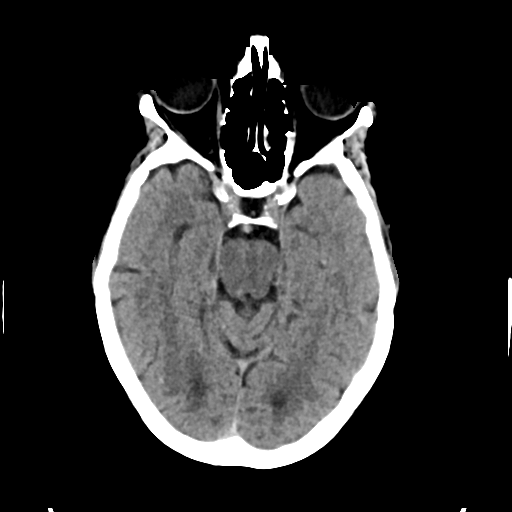
[im 18/35  brain]
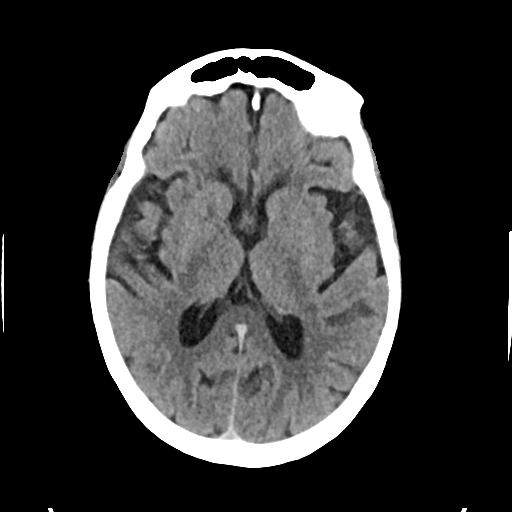
[im 22/35  brain]
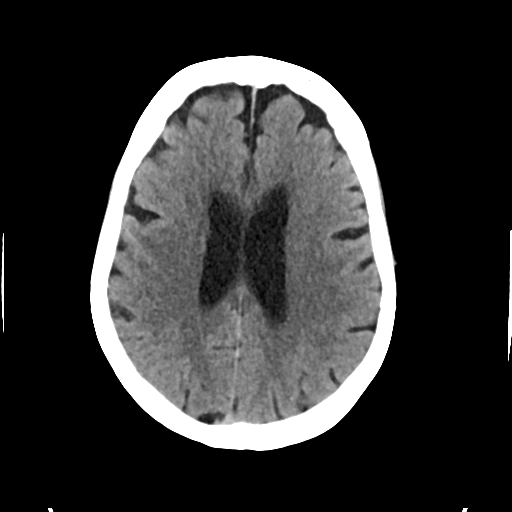
[im 22/35  bone]
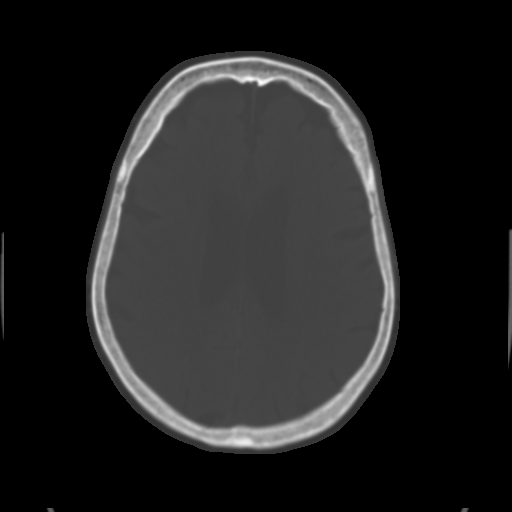
[im 26/35  brain]
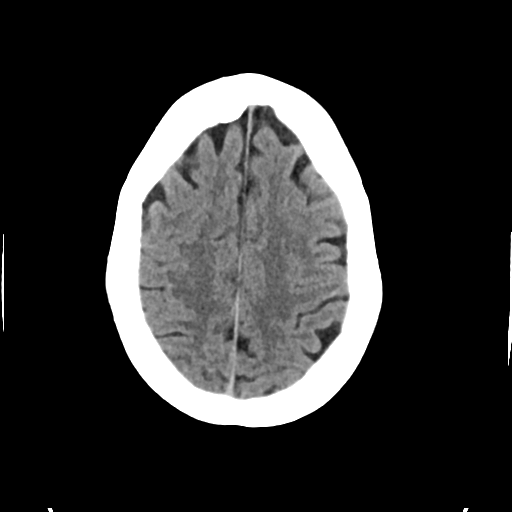
[im 30/35  brain]
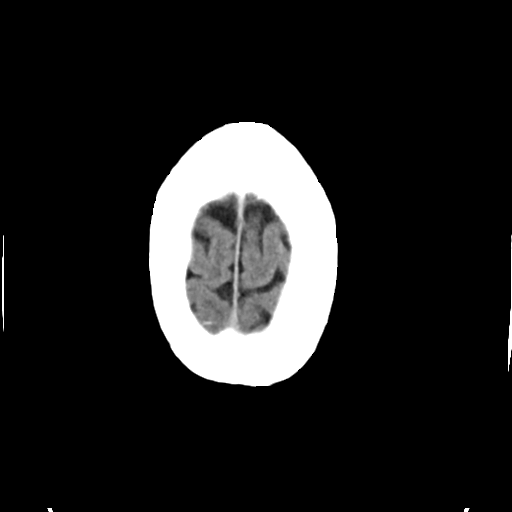

[Series 4: head bone · axial · 0.44mm/px · z∈[-158,-98]mm · 4 of 86 slices shown]
[im 9/86  bone]
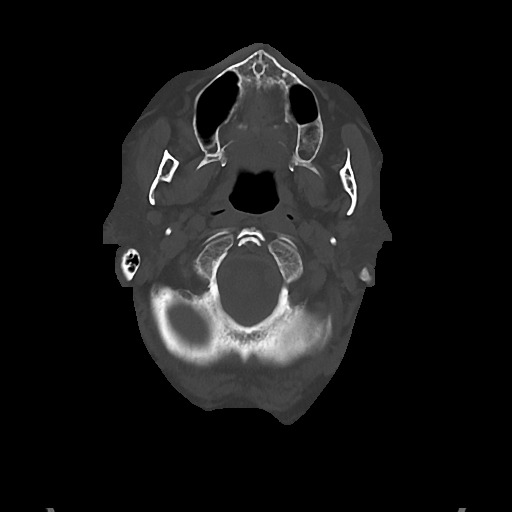
[im 18/86  bone]
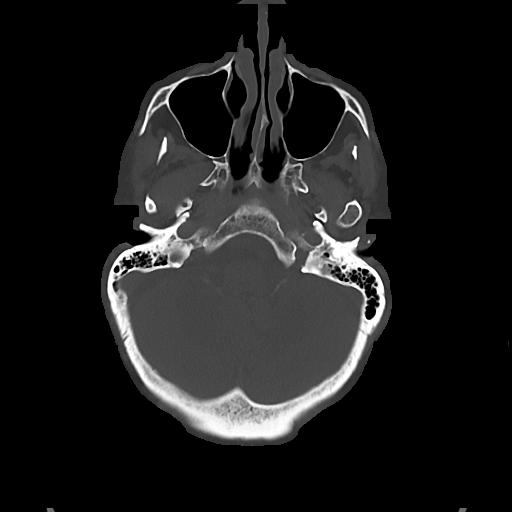
[im 26/86  bone]
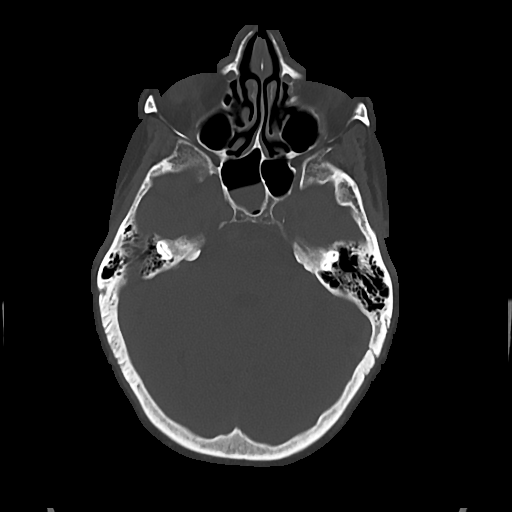
[im 39/86  bone]
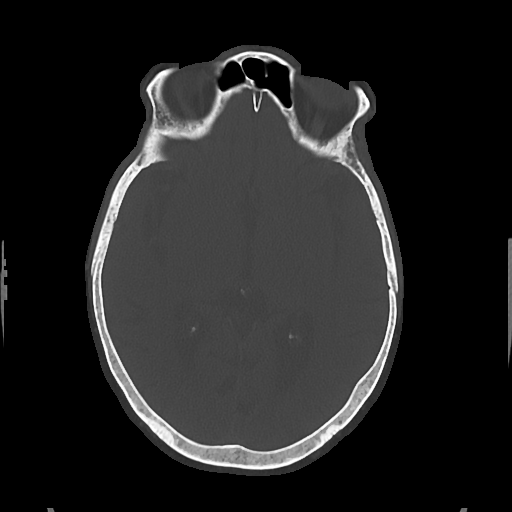

[Series 5: cor soft · coronal · 0.33mm/px · 3 of 73 slices shown]
[im 25/73  brain]
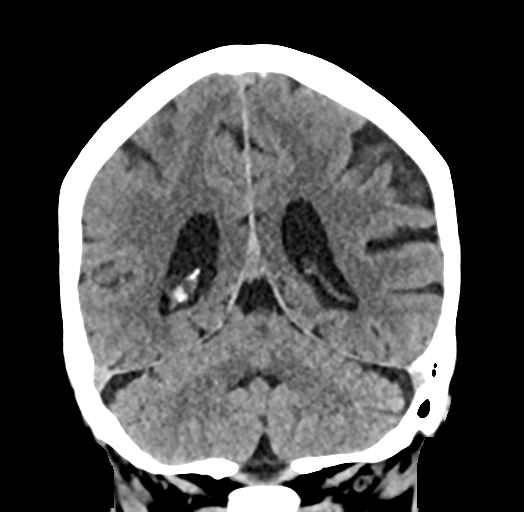
[im 33/73  brain]
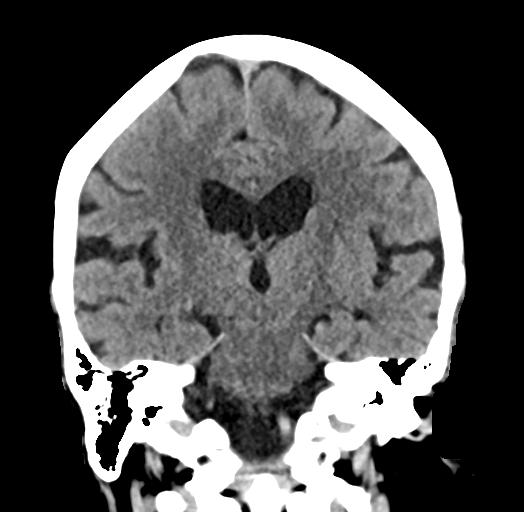
[im 41/73  brain]
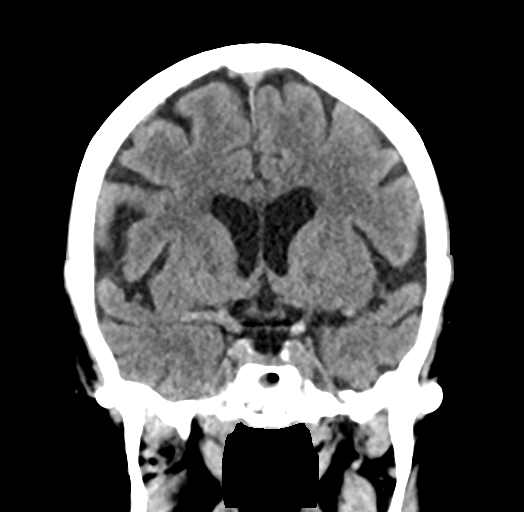

[Series 6: sag soft · sagittal · 0.33mm/px · 3 of 55 slices shown]
[im 19/55  brain]
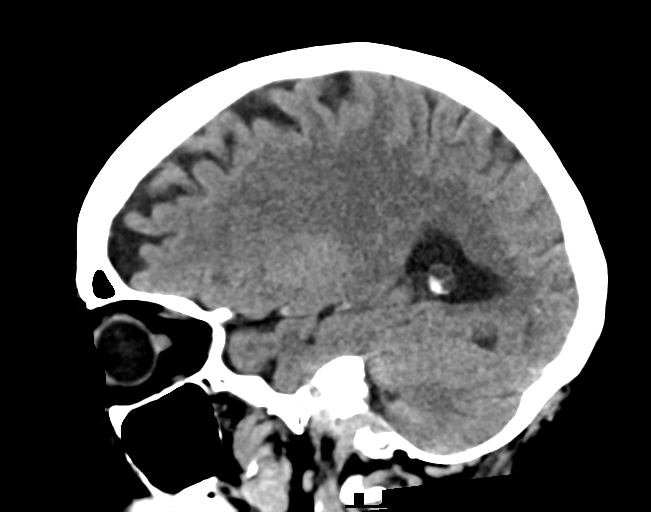
[im 28/55  brain]
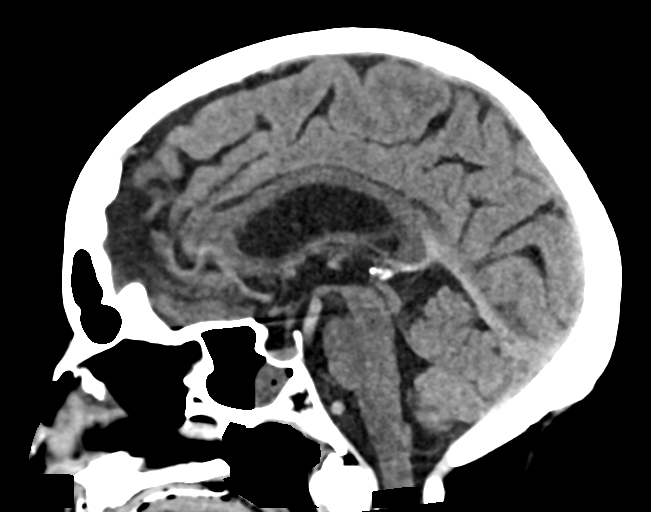
[im 37/55  brain]
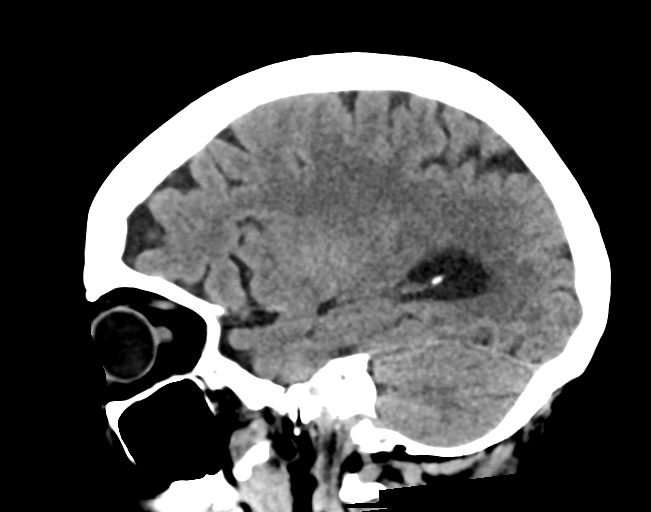

[17 of 47 positions shown; findings below may reference images not displayed]

FINDINGS: Brain: No evidence of acute infarction, hemorrhage, hydrocephalus,
extra-axial collection or mass lesion/mass effect.

Subcortical white matter and periventricular small vessel ischemic
changes.

Vascular: No hyperdense vessel or unexpected calcification.

Skull: Normal. Negative for fracture or focal lesion.

Sinuses/Orbits: Mild layering fluid in the right sphenoid sinus.
Visualized paranasal sinuses and mastoid air cells are otherwise
clear.

Other: None.
IMPRESSION: No evidence of acute intracranial abnormality. Mild small vessel
ischemic changes.

## 2020-07-23 ENCOUNTER — Emergency Department (HOSPITAL_COMMUNITY)
Admission: EM | Admit: 2020-07-23 | Discharge: 2020-07-24 | Disposition: A | Discharge status: Home / Self Care | Payer: Medicare Other | Attending: Emergency Medicine | Admitting: Emergency Medicine

## 2020-07-23 ENCOUNTER — Other Ambulatory Visit: Payer: Self-pay

## 2020-07-23 ENCOUNTER — Emergency Department (HOSPITAL_COMMUNITY): Payer: Medicare Other

## 2020-07-23 ENCOUNTER — Encounter (HOSPITAL_COMMUNITY): Payer: Self-pay

## 2020-07-23 DIAGNOSIS — Z7951 Long term (current) use of inhaled steroids: Secondary | ICD-10-CM | POA: Diagnosis not present

## 2020-07-23 DIAGNOSIS — K5733 Diverticulitis of large intestine without perforation or abscess with bleeding: Secondary | ICD-10-CM | POA: Insufficient documentation

## 2020-07-23 DIAGNOSIS — J449 Chronic obstructive pulmonary disease, unspecified: Secondary | ICD-10-CM | POA: Insufficient documentation

## 2020-07-23 DIAGNOSIS — F17210 Nicotine dependence, cigarettes, uncomplicated: Secondary | ICD-10-CM | POA: Diagnosis not present

## 2020-07-23 DIAGNOSIS — R197 Diarrhea, unspecified: Secondary | ICD-10-CM | POA: Diagnosis present

## 2020-07-23 LAB — CBC WITH DIFFERENTIAL/PLATELET
Abs Immature Granulocytes: 0.04 10*3/uL (ref 0.00–0.07)
Basophils Absolute: 0 10*3/uL (ref 0.0–0.1)
Basophils Relative: 0 %
Eosinophils Absolute: 0 10*3/uL (ref 0.0–0.5)
Eosinophils Relative: 0 %
HCT: 45.3 % (ref 36.0–46.0)
Hemoglobin: 15.2 g/dL — ABNORMAL HIGH (ref 12.0–15.0)
Immature Granulocytes: 0 %
Lymphocytes Relative: 18 %
Lymphs Abs: 2.2 10*3/uL (ref 0.7–4.0)
MCH: 29.9 pg (ref 26.0–34.0)
MCHC: 33.6 g/dL (ref 30.0–36.0)
MCV: 89.2 fL (ref 80.0–100.0)
Monocytes Absolute: 0.7 10*3/uL (ref 0.1–1.0)
Monocytes Relative: 6 %
Neutro Abs: 8.9 10*3/uL — ABNORMAL HIGH (ref 1.7–7.7)
Neutrophils Relative %: 76 %
Platelets: 226 10*3/uL (ref 150–400)
RBC: 5.08 MIL/uL (ref 3.87–5.11)
RDW: 13 % (ref 11.5–15.5)
WBC: 11.9 10*3/uL — ABNORMAL HIGH (ref 4.0–10.5)
nRBC: 0 % (ref 0.0–0.2)

## 2020-07-23 LAB — LIPASE, BLOOD: Lipase: 49 U/L (ref 11–51)

## 2020-07-23 LAB — COMPREHENSIVE METABOLIC PANEL
ALT: 12 U/L (ref 0–44)
AST: 15 U/L (ref 15–41)
Albumin: 3.9 g/dL (ref 3.5–5.0)
Alkaline Phosphatase: 60 U/L (ref 38–126)
Anion gap: 11 (ref 5–15)
BUN: 12 mg/dL (ref 8–23)
CO2: 21 mmol/L — ABNORMAL LOW (ref 22–32)
Calcium: 9.1 mg/dL (ref 8.9–10.3)
Chloride: 103 mmol/L (ref 98–111)
Creatinine, Ser: 0.77 mg/dL (ref 0.44–1.00)
GFR, Estimated: >60 mL/min (ref >60)
Glucose, Bld: 112 mg/dL — ABNORMAL HIGH (ref 70–99)
Potassium: 3.7 mmol/L (ref 3.5–5.1)
Sodium: 135 mmol/L (ref 135–145)
Total Bilirubin: 0.6 mg/dL (ref 0.3–1.2)
Total Protein: 7.4 g/dL (ref 6.5–8.1)

## 2020-07-23 LAB — TYPE AND SCREEN
ABO/RH(D): O NEG
Antibody Screen: NEGATIVE

## 2020-07-23 MED ORDER — AMOXICILLIN-POT CLAVULANATE 875-125 MG PO TABS
1.0000 | ORAL_TABLET | Freq: Once | ORAL | Status: AC
  Administered 2020-07-24: 1 via ORAL
  Filled 2020-07-23: qty 1

## 2020-07-23 MED ORDER — SODIUM CHLORIDE 0.9 % IV BOLUS
1000.0000 mL | Freq: Once | INTRAVENOUS | Status: AC
  Administered 2020-07-23: 1000 mL via INTRAVENOUS

## 2020-07-23 NOTE — ED Provider Notes (Signed)
[facility] EMERGENCY DEPARTMENT Provider Note   CSN: [id number] Arrival date & time: 07/23/20  1528     History Chief Complaint  Patient presents with  . Diarrhea    Amy Lucas is a 75 y.o. female past medical history of GERD, COPD, presenting for evaluation of abdominal pain.  Patient states symptoms began last night.  Initially she was having some right-sided lower abdominal pain, however has been having pain across her abdomen with bowel movements.  She is having softer stools than usual.  After 1 or 2 bowel movement she began having bright red blood in her stool.  It is a small amount, not in large quantities.  She is not having any rectal pain though does note history of hemorrhoids.  Not on anticoagulation.  Has having some nausea without vomiting. States she had colonoscopy about 3 years ago, no reported abnormalities.  The history is provided by the patient.       Past Medical History:  Diagnosis Date  . Anxiety   . Bladder infection   . Blood type, Rh negative   . COPD (chronic obstructive pulmonary disease) (HCC)   . Hernia   . High cholesterol   . History of chicken pox   . History of measles   . Ovarian cyst   . Vaginal dryness     Patient Active Problem List   Diagnosis Date Noted  . Atypical chest pain 05/03/2017  . Supraventricular tachycardia (HCC) 04/02/2017  . GERD (gastroesophageal reflux disease) 12/22/2016  . Chronic nonseasonal allergic rhinitis due to pollen 05/05/2016  . 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 Recurrent major depressive disorder (HCC) 04/30/2015  . Vaginal dryness   . Bladder infection   . COPD (chronic obstructive pulmonary disease) (HCC)   . High cholesterol   . Ovarian cyst   . Blood type, Rh negative   . Hernia   . Anxiety     Past Surgical History:  Procedure Laterality Date  . HEMORRHOID SURGERY       OB History    Gravida  1   Para  1   Term      Preterm      AB      Living  1      SAB      IAB      Ectopic      Multiple      Live Births  1           Family History  Problem Relation Age of Onset  . Cancer Maternal Grandmother   . Heart disease Mother   . Hypertension Mother   . Heart failure Mother   . Cancer Maternal Aunt     Social History   Tobacco Use  . Smoking status: Current Every Day Smoker    Packs/day: 1.00    Types: Cigarettes  . Smokeless tobacco: Never Used  Vaping Use  . Vaping Use: Every day  Substance Use Topics  . Alcohol use: Yes  . Drug use: No    Home Medications Prior to Admission medications   Medication Sig Start Date End Date Taking? Authorizing Provider  amoxicillin-clavulanate (AUGMENTIN) 875-125 MG tablet Take 1 tablet by mouth every 12 (twelve) hours. 07/24/20  Yes Roxan Hockey, [place] N, PA-C  ondansetron (ZOFRAN ODT) 4 MG disintegrating tablet Take 1 tablet (4 mg total) by mouth every 8 (eight) hours as needed for nausea or vomiting. 07/24/20  Yes Sharena Dibenedetto, [place] N, PA-C  albuterol (PROAIR HFA) 108 (  90 Base) MCG/ACT inhaler INHALE 2 PUFFS BY MOUTH EVERY 4 HOURS AS NEEDED 03/02/17   [provider]  ALPRAZolam Prudy Feeler) 1 MG tablet Take 1 mg by mouth at bedtime as needed for anxiety. Take 1-4 tablets daily as needed    [provider]  diclofenac sodium (VOLTAREN) 1 % GEL Apply bid as needed to painful joints 1gm 05/05/16   [provider]  Fluticasone-Salmeterol (ADVAIR) 250-50 MCG/DOSE AEPB Inhale into the lungs.    [provider]  ibandronate (BONIVA) 150 MG tablet TK 1 T PO Q MONTH 02/18/17   [provider]  loratadine-pseudoephedrine (CLARITIN-D 24 HOUR) 10-240 MG 24 hr tablet TK 1 T PO QD 03/02/17   [provider]  meclizine (ANTIVERT) 25 MG tablet Take 1 tablet (25 mg total) by mouth 3 (three) times daily as needed for dizziness. 09/21/18   Gwyneth Sprout, MD  omeprazole (PRILOSEC) 40 MG capsule TK 1 C PO 20 MIN B BRE 02/11/17   [provider]   polyethylene glycol powder (GLYCOLAX/MIRALAX) powder  12/23/16   [provider]  simvastatin (ZOCOR) 40 MG tablet TK 1 T PO QHS 02/12/17   [provider]    Allergies    Sulfa antibiotics and Triamcinolone acetonide  Review of Systems   Review of Systems  Gastrointestinal: Positive for abdominal pain, blood in stool and nausea.  All other systems reviewed and are negative.   Physical Exam Updated Vital Signs BP (!) 111/59   Pulse 87   Temp 98.2 F (36.8 C) (Oral)   Resp 13   SpO2 93%   Physical Exam Vitals and nursing note reviewed.  Constitutional:      Appearance: She is well-developed.  HENT:     Head: Normocephalic and atraumatic.  Eyes:     Conjunctiva/sclera: Conjunctivae normal.  Cardiovascular:     Rate and Rhythm: Normal rate and regular rhythm.  Pulmonary:     Effort: Pulmonary effort is normal. No respiratory distress.     Breath sounds: Normal breath sounds. No wheezing.  Abdominal:     General: Bowel sounds are normal.     Palpations: Abdomen is soft.     Tenderness: There is no abdominal tenderness. There is no guarding or rebound.  Genitourinary:    Comments: Rectal exam performed with female RN chaperone present.  Multiple external hemorrhoids are noted, not thrombosed, nontender, not actively bleeding.  There is small amount of gross dark red blood in the rectum.  No tenderness Skin:    General: Skin is warm.  Neurological:     Mental Status: She is alert.  Psychiatric:        Behavior: Behavior normal.     ED Results / Procedures / Treatments   Labs (all labs ordered are listed, but only abnormal results are displayed) Labs Reviewed  CBC WITH DIFFERENTIAL/PLATELET - Abnormal; Notable for the following components:      Result Value   WBC 11.9 (*)    Hemoglobin 15.2 (*)    Neutro Abs 8.9 (*)    All other components within normal limits  COMPREHENSIVE METABOLIC PANEL - Abnormal; Notable for the following components:    CO2 21 (*)    Glucose, Bld 112 (*)    All other components within normal limits  LIPASE, BLOOD  URINALYSIS, ROUTINE W REFLEX MICROSCOPIC  POC OCCULT BLOOD, ED  TYPE AND SCREEN  ABO/RH    EKG EKG Interpretation  Date/Time:  Friday Jul 23 2020 21:45:08 EDT Ventricular  Rate:  104 PR Interval:  151 QRS Duration: 80 QT Interval:  337 QTC Calculation: 444 R Axis:   35 Text Interpretation: Sinus tachycardia Confirmed by Norman ClayHong, Joshua ([id number]) on 07/23/2020 11:54:14 PM   Radiology CT Abdomen Pelvis Wo Contrast  Result Date: 07/23/2020 CLINICAL DATA:  Abdominal pain diverticulitis suspected EXAM: CT ABDOMEN AND PELVIS WITHOUT CONTRAST TECHNIQUE: Multidetector CT imaging of the abdomen and pelvis was performed following the standard protocol without IV contrast. COMPARISON:  CT May 31, 2017. FINDINGS: Lower chest: Large hiatal hernia. No acute abnormality. Normal size heart. No significant pericardial effusion/thickening. Hepatobiliary: Similar size of the low-density 3.4 cm multilobulated cystic lesion in the left lobe of the liver. Tiny layering radiopaque gallstones without evidence of acute cholecystitis. No biliary ductal dilation. Pancreas: Within normal limits. Spleen: Within normal limits. Adrenals/Urinary Tract: Adrenal glands are unremarkable. Kidneys are normal, without renal calculi, contour deforming lesion, or hydronephrosis. Bladder is remarkable for a small cystocele with pelvic floor laxity. Stomach/Bowel: Large hiatal hernia otherwise stomach is grossly unremarkable. No pathologic enlargement of small bowel. Appendix is not definitely visualized however there is no pericecal inflammation. Extensive left-sided predominant colonic diverticulosis with descending colonic wall thickening and adjacent inflammatory stranding without evidence of frank perforation or walled off fluid collection. Vascular/Lymphatic: Aortic atherosclerosis. No enlarged abdominal or pelvic lymph nodes.  Reproductive: Uterus and bilateral adnexa are unremarkable. Other: Pelvic floor laxity with small cystocele. Musculoskeletal: Multilevel degenerative changes spine. No acute osseous abnormality. IMPRESSION: 1. Acute descending colonic diverticulitis without evidence of abscess or frank perforation. 2. Large hiatal hernia. 3. 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 4. Cholelithiasis without evidence of acute cholecystitis. 5. Aortic atherosclerosis. Aortic Atherosclerosis (ICD10-I70.0). Electronically Signed   By: Maudry MayhewJeffrey  Waltz MD   On: 07/23/2020 23:18    Procedures Procedures   Medications Ordered in ED Medications  amoxicillin-clavulanate (AUGMENTIN) 875-125 MG per tablet 1 tablet (has no administration in time range)  sodium chloride 0.9 % bolus 1,000 mL (1,000 mLs Intravenous New Bag/Given 07/23/20 2310)    ED Course  I have reviewed the triage vital signs and the nursing notes.  Pertinent labs & imaging results that were available during my care of the patient were reviewed by me and considered in my medical decision making (see chart for details).    MDM Rules/Calculators/A&P                          Patient 75 year old female presenting with soft stools, abdominal pain, and bright red blood per rectum.  Symptoms began yesterday.  Some nausea without vomiting.  Afebrile.  On exam she is well-appearing and in no distress.  Abdomen is soft and nontender.  Rectal exam with multiple external nontender nonthrombosed hemorrhoids.  There is small amount of dark red blood present in the rectum.  Mild leukocytosis, hemoglobin is 15.2, likely mild signs of dehydration.  She is given IV fluids.  CT scan is consistent with uncomplicated diverticulitis.  Also discussed incidental CT findings with patient she will be treated with Augmentin, clear liquids, Zofran as needed, and close outpatient follow-up.  Patient is in agreement with this care plan discharged in no distress.  Discussed results,  findings, treatment and follow up. Patient advised of return precautions. Patient verbalized understanding and agreed with plan.  Final Clinical Impression(s) / ED Diagnoses Final diagnoses:  Diverticulitis of large intestine without perforation or abscess with bleeding    Rx / DC Orders ED Discharge Orders  Ordered    amoxicillin-clavulanate (AUGMENTIN) 875-125 MG tablet  Every 12 hours        07/24/20 0006    ondansetron (ZOFRAN ODT) 4 MG disintegrating tablet  Every 8 hours PRN        07/24/20 0006           Destony Prevost, [place] N, PA-C 07/24/20 0007    Cheryll Cockayne, MD 07/26/20 [phone]

## 2020-07-23 NOTE — ED Notes (Signed)
Called name x 1 for vitals. No response.

## 2020-07-23 NOTE — ED Triage Notes (Signed)
Pt states she had been constipated and woke up in the middle of the night with diarrhea. Pt had 3-4  diarrhea episodes then started having bright red blood in stool x 3 episodes. Pt states she has lower abdominal pain, nausea and headache. Pt states she feels very weak and has had episodes of feeling clammy and hot.

## 2020-07-23 NOTE — ED Provider Notes (Signed)
Emergency Medicine Provider Triage Evaluation Note  Amy Lucas , a 75 y.o. female  was evaluated in triage.  Pt complains of diarrhea and rectal bleeding.  Patient reports diarrhea started last night had 3-4 bowel movements without blood in stool.  Patient had 4 episodes of rectal bleeding.  Patient describes bleeding as bright red.  Patient denies any blood in her use.  Patient denies any previous episodes of rectal bleeding.  Review of Systems  Positive: Lower abdominal pain, diarrhea, blood in stool, nausea, fatigue, dizziness, chills Negative: Vomiting, melena, fever, chest pain, shortness of breath, syncopal episode  Physical Exam  There were no vitals taken for this visit. Gen:   Awake, no distress   Resp:  Normal effort  MSK:   Moves extremities without difficulty  Other:  Abdomen soft, nondistended, tenderness to bilateral lower quadrant  Medical Decision Making  Medically screening exam initiated at 3:46 PM.  Appropriate orders placed.  Amy Lucas was informed that the remainder of the evaluation will be completed by another provider, this initial triage assessment does not replace that evaluation, and the importance of remaining in the ED until their evaluation is complete.  The patient appears stable so that the remainder of the work up may be completed by another provider.      Haskel Schroeder, PA-C 07/23/20 1552    Margarita Grizzle, MD 07/23/20 [phone]

## 2020-07-23 NOTE — ED Notes (Signed)
Patient had left @ 19:30 but was not feeling better and called EMS again to bring her back to hospital.

## 2020-07-23 NOTE — ED Notes (Signed)
Patient transported to CT 

## 2020-07-24 ENCOUNTER — Telehealth (HOSPITAL_COMMUNITY): Payer: Self-pay | Admitting: Emergency Medicine

## 2020-07-24 MED ORDER — AMOXICILLIN-POT CLAVULANATE 875-125 MG PO TABS: 1.0000 | ORAL_TABLET | Freq: Two times a day (BID) | ORAL | 0 refills | Status: DC

## 2020-07-24 MED ORDER — ONDANSETRON 4 MG PO TBDP: 4.0000 mg | ORAL_TABLET | Freq: Three times a day (TID) | ORAL | 0 refills | Status: DC | PRN

## 2020-07-24 NOTE — ED Notes (Signed)
IV has been taken out 

## 2020-07-24 NOTE — ED Notes (Signed)
Pt's daughter stated she disconnected pt's fluids to go to bathroom and was asking for a gauze to remove pt's PIV. Explained to daughter that staff needs to remove PIV from pt. Verbalized understanding.

## 2020-07-24 NOTE — Discharge Instructions (Addendum)
Please read instructions below. Drink clear liquids until your belly feels better. Then, slowly introduce bland foods into your diet as tolerated, such as bread, rice, apples, bananas. You can take zofran every 8 hours as needed for nausea. Take the antibiotic, Augmentin, as directed until gone.  Follow up with your primary care/GI doctor regarding your visit today. Return to the ER for severely worsening abdominal pain, worsening bleeding, high fever, uncontrollable vomiting, or new or concerning symptoms.

## 2021-10-17 IMAGING — CT CT ABD-PELV W/O CM
2 of 4 series · 16 of 46 positions shown, 18 images · non-contrast
Comparison: CT May 31, 2017.

CLINICAL DATA: Abdominal pain diverticulitis suspected

EXAM:
CT ABDOMEN AND PELVIS WITHOUT CONTRAST
TECHNIQUE: Multidetector CT imaging of the abdomen and pelvis was performed
following the standard protocol without IV contrast.

[Series 3: a/p w/o 5mm · axial · non-contrast · 0.76mm/px · z∈[+636,+1051]mm · 13 of 91 slices shown, 15 images]
[im 4/91  soft-tissue]
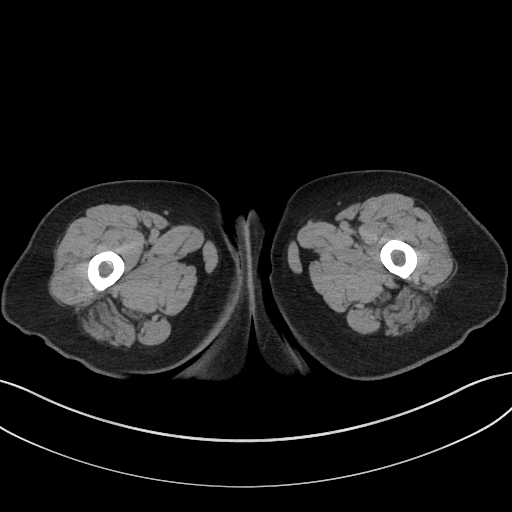
[im 4/91  bone]
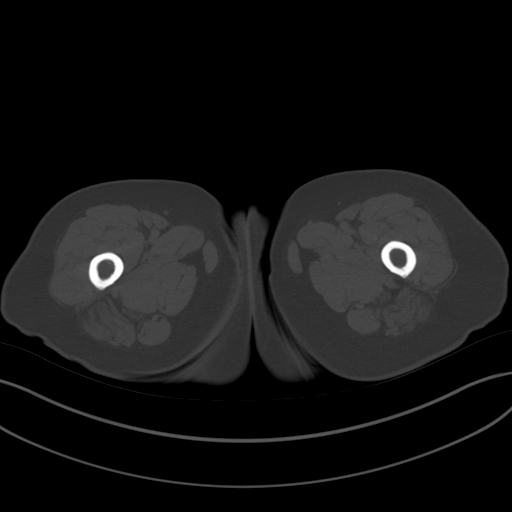
[im 11/91  soft-tissue]
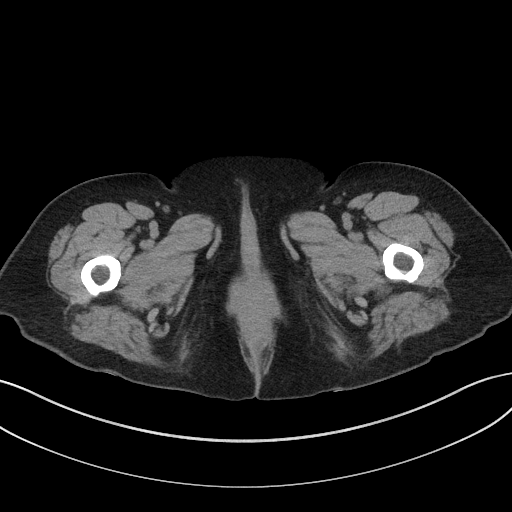
[im 18/91  soft-tissue]
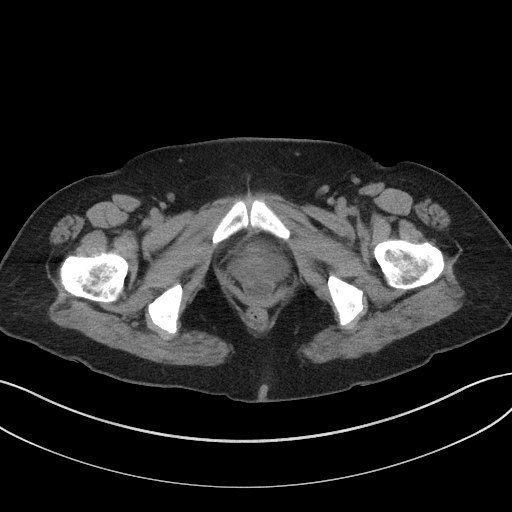
[im 25/91  soft-tissue]
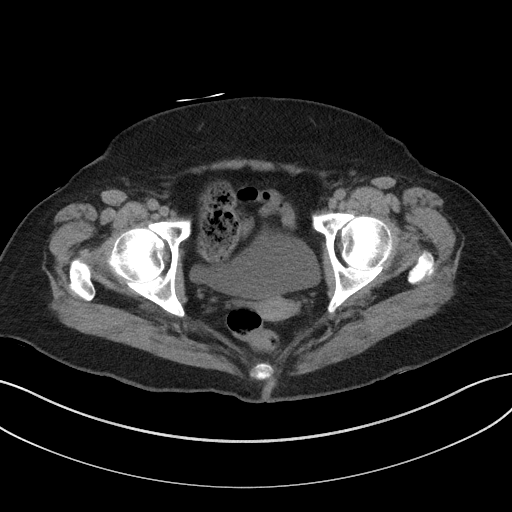
[im 32/91  soft-tissue]
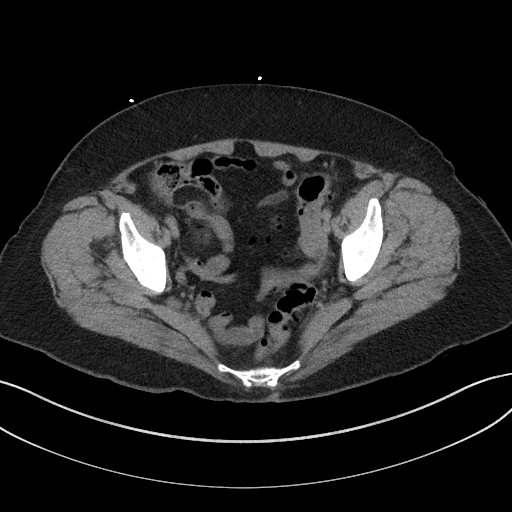
[im 39/91  soft-tissue]
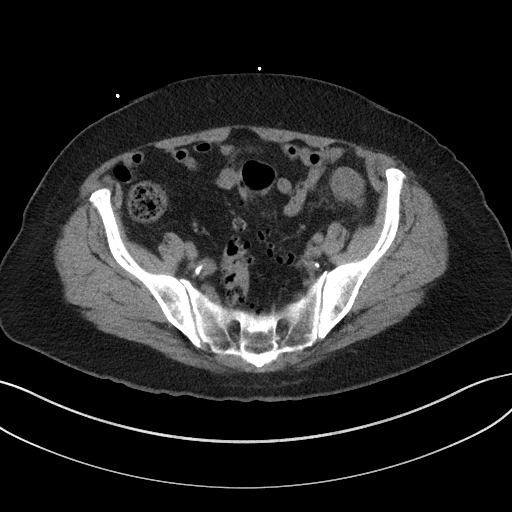
[im 46/91  soft-tissue]
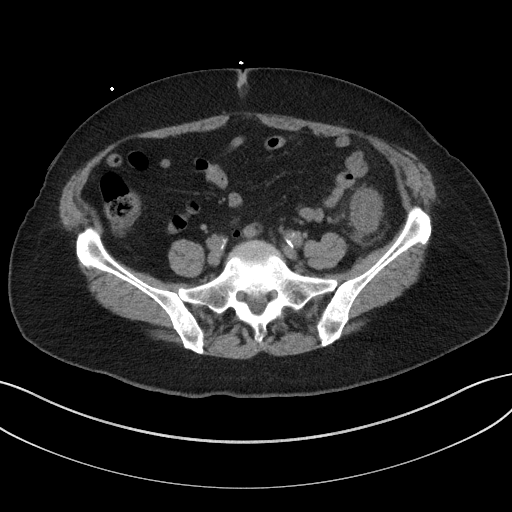
[im 52/91  soft-tissue]
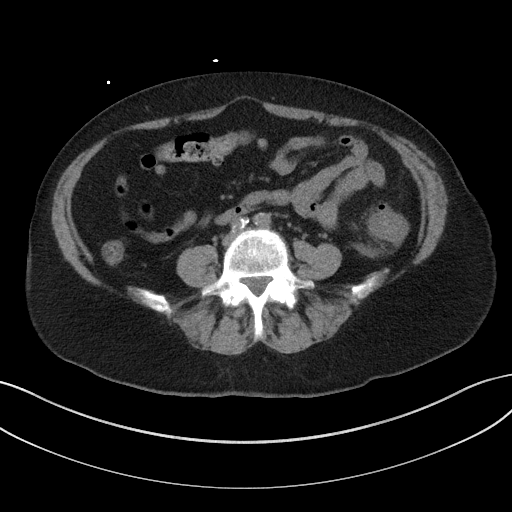
[im 59/91  soft-tissue]
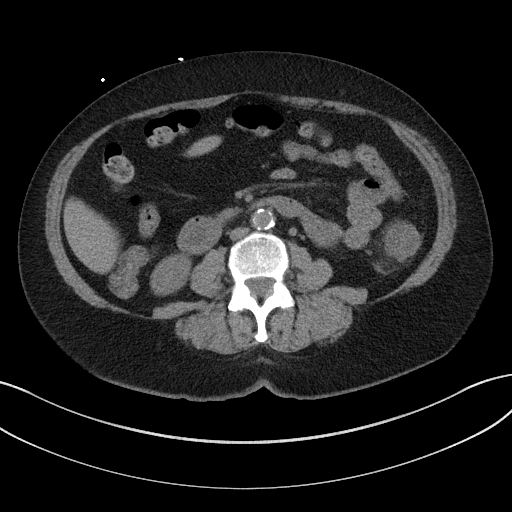
[im 59/91  bone]
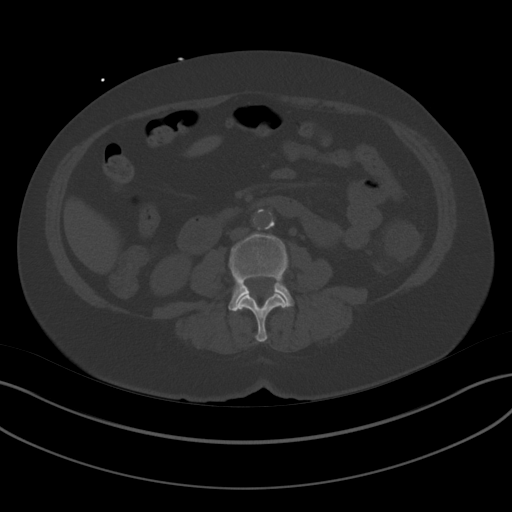
[im 66/91  soft-tissue]
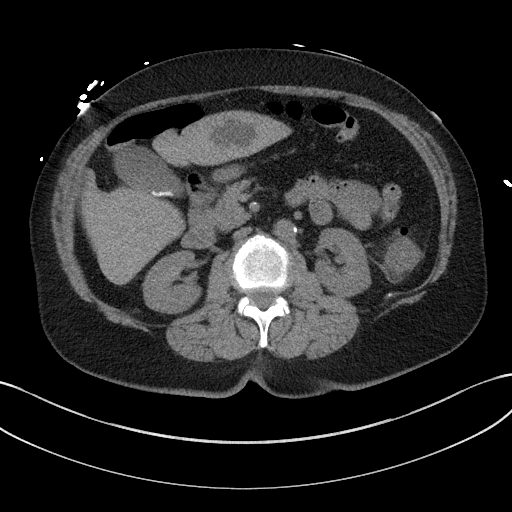
[im 73/91  soft-tissue]
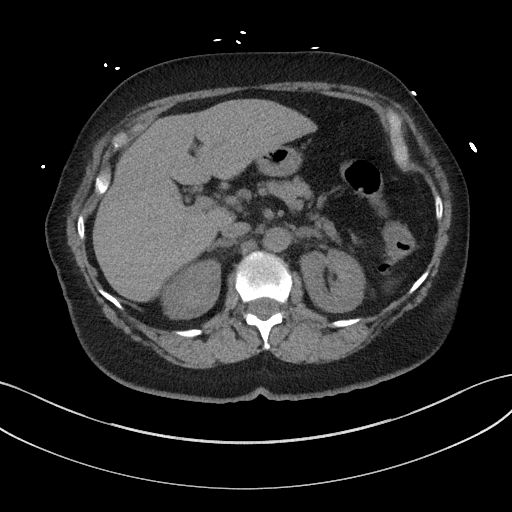
[im 80/91  soft-tissue]
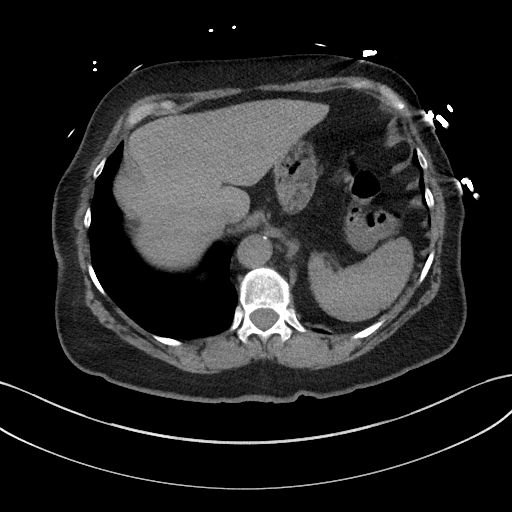
[im 87/91  soft-tissue]
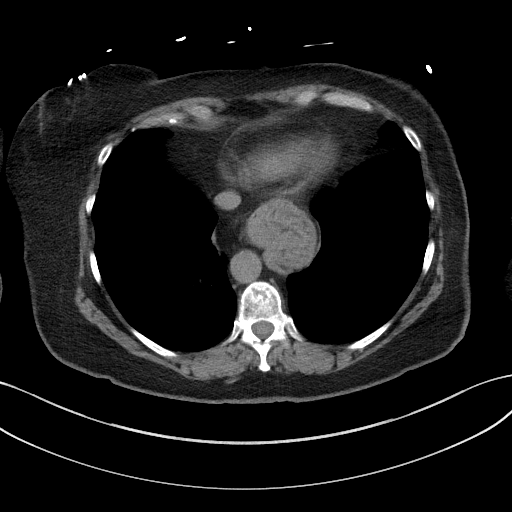

[Series 6: a/p w/o cor · coronal · non-contrast · 0.77mm/px · 3 of 127 slices shown]
[im 43/127  soft-tissue]
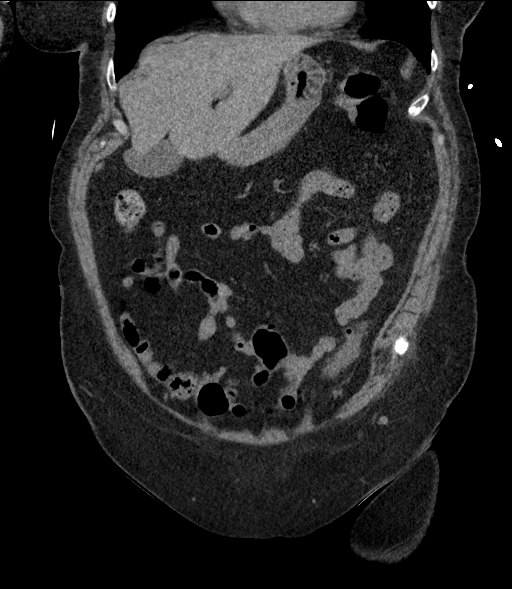
[im 57/127  soft-tissue]
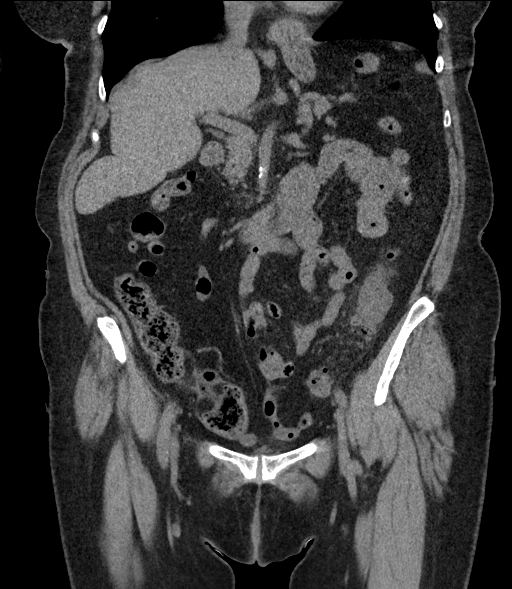
[im 71/127  soft-tissue]
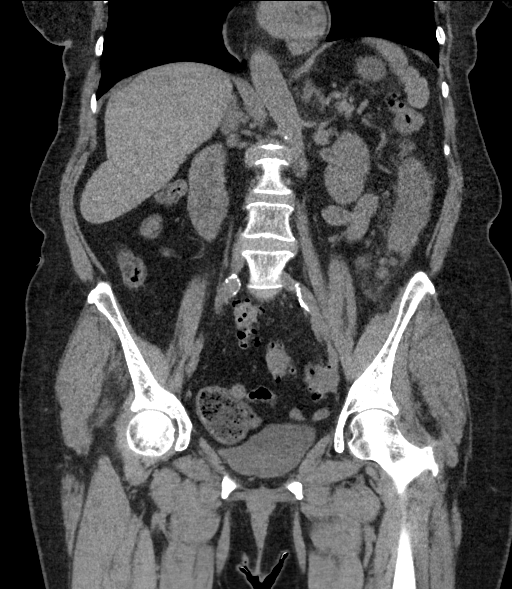

[16 of 46 positions shown; findings below may reference images not displayed]

FINDINGS: Lower chest: Large hiatal hernia. No acute abnormality. Normal size
heart. No significant pericardial effusion/thickening.

Hepatobiliary: Similar size of the low-density 3.4 cm multilobulated
cystic lesion in the left lobe of the liver. Tiny layering
radiopaque gallstones without evidence of acute cholecystitis. No
biliary ductal dilation.

Pancreas: Within normal limits.

Spleen: Within normal limits.

Adrenals/Urinary Tract: Adrenal glands are unremarkable. Kidneys are
normal, without renal calculi, contour deforming lesion, or
hydronephrosis. Bladder is remarkable for a small cystocele with
pelvic floor laxity.

Stomach/Bowel: Large hiatal hernia otherwise stomach is grossly
unremarkable. No pathologic enlargement of small bowel. Appendix is
not definitely visualized however there is no pericecal
inflammation. Extensive left-sided predominant colonic
diverticulosis with descending colonic wall thickening and adjacent
inflammatory stranding without evidence of frank perforation or
walled off fluid collection.

Vascular/Lymphatic: Aortic atherosclerosis. No enlarged abdominal or
pelvic lymph nodes.

Reproductive: Uterus and bilateral adnexa are unremarkable.

Other: Pelvic floor laxity with small cystocele.

Musculoskeletal: Multilevel degenerative changes spine. No acute
osseous abnormality.
IMPRESSION: 1. Acute descending colonic diverticulitis without evidence of
abscess or frank perforation.
2. Large hiatal hernia.
3. Pelvic floor laxity with a small cystocele.
4. Cholelithiasis without evidence of acute cholecystitis.
5. Aortic atherosclerosis.

Aortic Atherosclerosis ([id number]).

## 2022-11-09 ENCOUNTER — Other Ambulatory Visit: Payer: Self-pay | Admitting: Specialist

## 2022-11-09 DIAGNOSIS — Z1231 Encounter for screening mammogram for malignant neoplasm of breast: Secondary | ICD-10-CM

## 2022-11-29 ENCOUNTER — Ambulatory Visit
Admission: RE | Admit: 2022-11-29 | Discharge: 2022-11-29 | Disposition: A | Discharge status: Home / Self Care | Payer: Medicare Other | Source: Ambulatory Visit | Attending: Specialist | Admitting: Specialist

## 2022-11-29 DIAGNOSIS — Z1231 Encounter for screening mammogram for malignant neoplasm of breast: Secondary | ICD-10-CM

## 2022-12-01 ENCOUNTER — Other Ambulatory Visit: Payer: Self-pay | Admitting: Specialist

## 2022-12-01 DIAGNOSIS — R928 Other abnormal and inconclusive findings on diagnostic imaging of breast: Secondary | ICD-10-CM

## 2022-12-07 ENCOUNTER — Ambulatory Visit
Admission: RE | Admit: 2022-12-07 | Discharge: 2022-12-07 | Disposition: A | Discharge status: Home / Self Care | Payer: Medicare Other | Source: Ambulatory Visit | Attending: Specialist | Admitting: Specialist

## 2022-12-07 ENCOUNTER — Other Ambulatory Visit: Payer: Self-pay | Admitting: Specialist

## 2022-12-07 DIAGNOSIS — R928 Other abnormal and inconclusive findings on diagnostic imaging of breast: Secondary | ICD-10-CM

## 2022-12-07 DIAGNOSIS — N6310 Unspecified lump in the right breast, unspecified quadrant: Secondary | ICD-10-CM

## 2022-12-12 ENCOUNTER — Ambulatory Visit
Admission: RE | Admit: 2022-12-12 | Discharge: 2022-12-12 | Disposition: A | Discharge status: Home / Self Care | Payer: Medicare Other | Source: Ambulatory Visit | Attending: Specialist | Admitting: Specialist

## 2022-12-12 DIAGNOSIS — N6310 Unspecified lump in the right breast, unspecified quadrant: Secondary | ICD-10-CM

## 2022-12-12 DIAGNOSIS — R928 Other abnormal and inconclusive findings on diagnostic imaging of breast: Secondary | ICD-10-CM

## 2022-12-12 HISTORY — PX: BREAST BIOPSY: SHX20

## 2022-12-13 ENCOUNTER — Telehealth: Payer: Self-pay | Admitting: Oncology

## 2022-12-13 ENCOUNTER — Other Ambulatory Visit: Payer: Self-pay | Admitting: Specialist

## 2022-12-13 DIAGNOSIS — N6310 Unspecified lump in the right breast, unspecified quadrant: Secondary | ICD-10-CM

## 2022-12-13 LAB — SURGICAL PATHOLOGY

## 2022-12-13 NOTE — Telephone Encounter (Signed)
Voicemail was received from [place], nurse navigator over at [facility] stating that this pt is a new Breast Cancer diagnosis and would need to establish care with our facility. Pt was requesting Dr. Gilman Buttner specifically.   I left a msg back for Larita Fife letting her know that we are glad to see the pt here but unfortunately it would be with a different physician since Dr. Gilman Buttner is not accepting new patients and that we would also need a referral entered into the system.   After leaving that msg I called pt who then requested I call her daughter to discuss this further. When I was able to speak with her daughter she said she would be calling Larita Fife to discuss this further since her mother would be more comfortable seeing a female physician they may just have the referral sent to [place] at [facility].

## 2022-12-14 ENCOUNTER — Telehealth: Payer: Self-pay | Admitting: Hematology and Oncology

## 2022-12-14 NOTE — Telephone Encounter (Signed)
Spoke to patient' daughter to confirm upcoming afternoon [facility] clinic appointment on 10/9, paperwork will be sent via e-mail.   Gave location and time, also informed patient that the surgeon's office would be calling as well to get information from them similar to the packet that they will be receiving so make sure to do both.  Reminded patient that all providers will be coming to the clinic to see them HERE and if they had any questions to not hesitate to reach back out to myself or their navigators.

## 2022-12-18 ENCOUNTER — Encounter: Payer: Self-pay | Admitting: *Deleted

## 2022-12-18 ENCOUNTER — Ambulatory Visit
Admission: RE | Admit: 2022-12-18 | Discharge: 2022-12-18 | Disposition: A | Discharge status: Home / Self Care | Payer: Medicare Other | Source: Ambulatory Visit | Attending: Specialist | Admitting: Specialist

## 2022-12-18 DIAGNOSIS — C50411 Malignant neoplasm of upper-outer quadrant of right female breast: Secondary | ICD-10-CM

## 2022-12-18 DIAGNOSIS — N6310 Unspecified lump in the right breast, unspecified quadrant: Secondary | ICD-10-CM

## 2022-12-18 HISTORY — PX: BREAST BIOPSY: SHX20

## 2022-12-18 NOTE — Progress Notes (Signed)
Radiation Oncology         [phone] ________________________________  Name: Amy Lucas        MRN: [id number]  Date of Service: 12/20/2022 DOB: Sep 26, 1945  WU:[id number], Amy Lucas., MD  Emelia Loron, MD     REFERRING PHYSICIAN: Emelia Loron, MD   DIAGNOSIS: The encounter diagnosis was Malignant neoplasm of upper-outer quadrant of right breast in female, estrogen receptor positive (HCC).   HISTORY OF PRESENT ILLNESS: Amy Lucas is a 77 y.o. female seen in the multidisciplinary breast clinic for a new diagnosis of right breast cancer. The patient was noted to have screening detected asymmetry given the possible mass in the right breast.  Further workup showed persistence of a mass at 11:00 measuring 1.1 cm.  There was also a mass in This is a 12 o'clock position also measuring 1.1 cm.  Biopsies of the 12 o'clock position revealed benign breast findings.  The 11:00 lesion however showed a grade 2 invasive lobular carcinoma that was ER/PR positive, HER2 negative with a Ki-67 of 2%.  She had a stereotactic biopsy on 12/18/2022 of the asymmetry originally seen on mammography which showed a grade 2 invasive lobular carcinoma. She's seen today to discuss treatment recommendations of her care.    PREVIOUS RADIATION THERAPY: No   PAST MEDICAL HISTORY:  Past Medical History:  Diagnosis Date   Anxiety    Bladder infection    Blood type, Rh negative    Breast cancer (HCC)    COPD (chronic obstructive pulmonary disease) (HCC)    Hernia    High cholesterol    History of chicken pox    History of measles    Hypertension    Ovarian cyst    Vaginal dryness        PAST SURGICAL HISTORY: Past Surgical History:  Procedure Laterality Date   BREAST BIOPSY Right 12/12/2022   [place] RT BREAST BX W LOC DEV 1ST LESION IMG BX SPEC US GUIDE 12/12/2022 [facility]   BREAST BIOPSY Right 12/12/2022   [place] RT BREAST BX W LOC DEV EA ADD LESION IMG BX SPEC US GUIDE 12/12/2022 [facility]  MAMMOGRAPHY   BREAST BIOPSY Right 12/18/2022   MM RT BREAST BX W LOC DEV 1ST LESION IMAGE BX SPEC STEREO GUIDE 12/18/2022 [facility]   HEMORRHOID SURGERY     NASAL SINUS SURGERY     TRIGGER FINGER RELEASE       FAMILY HISTORY:  Family History  Problem Relation Age of Onset   Heart disease Mother    Hypertension Mother    Heart failure Mother    Breast cancer Maternal Grandmother    Breast cancer Maternal Aunt      SOCIAL HISTORY:  reports that she has been smoking cigarettes. She has never used smokeless tobacco. She reports current alcohol use. She reports that she does not use drugs. The patient is married and lives in [place]. She is accompanied by her daughter Amy Lucas.  ALLERGIES: Sulfa antibiotics and Triamcinolone acetonide   MEDICATIONS:  Current Outpatient Medications  Medication Sig Dispense Refill   albuterol (PROAIR HFA) 108 (90 Base) MCG/ACT inhaler INHALE 2 PUFFS BY MOUTH EVERY 4 HOURS AS NEEDED (Patient not taking: Reported on 12/20/2022)     ALPRAZolam (XANAX) 1 MG tablet Take 1 mg by mouth at bedtime as needed for anxiety. Take 1-4 tablets daily as needed     amLODipine (NORVASC) 10 MG tablet Take 2.5 mg by mouth daily.     anastrozole (ARIMIDEX) 1 MG  tablet Take 1 tablet (1 mg total) by mouth daily. 90 tablet 3   diclofenac sodium (VOLTAREN) 1 % GEL Apply bid as needed to painful joints 1gm (Patient not taking: Reported on 12/20/2022)     Fluticasone-Salmeterol (ADVAIR) 250-50 MCG/DOSE AEPB Inhale into the lungs. (Patient not taking: Reported on 12/20/2022)     ibandronate (BONIVA) 150 MG tablet TK 1 T PO Q MONTH     loratadine-pseudoephedrine (CLARITIN-D 24 HOUR) 10-240 MG 24 hr tablet TK 1 T PO QD (Patient not taking: Reported on 12/20/2022)     meclizine (ANTIVERT) 25 MG tablet Take 1 tablet (25 mg total) by mouth 3 (three) times daily as needed for dizziness. (Patient not taking: Reported on 12/20/2022) 30 tablet 0   omeprazole (PRILOSEC) 40 MG capsule  TK 1 C PO 20 MIN B BRE (Patient not taking: Reported on 12/20/2022)     ondansetron (ZOFRAN ODT) 4 MG disintegrating tablet Take 1 tablet (4 mg total) by mouth every 8 (eight) hours as needed for nausea or vomiting. (Patient not taking: Reported on 12/20/2022) 20 tablet 0   polyethylene glycol powder (GLYCOLAX/MIRALAX) powder  (Patient not taking: Reported on 12/20/2022)     simvastatin (ZOCOR) 40 MG tablet TK 1 T PO QHS     No current facility-administered medications for this encounter.     REVIEW OF SYSTEMS: On review of systems, the patient reports that she is doing well without breast complaints.     PHYSICAL EXAM:  Wt Readings from Last 3 Encounters:  12/20/22 128 lb 12.8 oz (58.4 kg)  05/03/17 144 lb (65.3 kg)  04/02/17 143 lb (64.9 kg)   Temp Readings from Last 3 Encounters:  12/20/22 (!) 97.2 F (36.2 C) (Temporal)  07/24/20 98.2 F (36.8 C) (Oral)  09/21/18 98.4 F (36.9 C) (Oral)   BP Readings from Last 3 Encounters:  12/20/22 128/85  07/24/20 112/65  09/21/18 131/74   Pulse Readings from Last 3 Encounters:  12/20/22 97  07/24/20 90  09/21/18 84    In general this is a well appearing Caucasian female in no acute distress. She's alert and oriented x4 and appropriate throughout the examination. Cardiopulmonary assessment is negative for acute distress and she exhibits normal effort. Bilateral breast exam is deferred.    ECOG = 0  0 - Asymptomatic (Fully active, able to carry on all predisease activities without restriction)  1 - Symptomatic but completely ambulatory (Restricted in physically strenuous activity but ambulatory and able to carry out work of a light or sedentary nature. For example, light housework, office work)  2 - Symptomatic, <50% in bed during the day (Ambulatory and capable of all self care but unable to carry out any work activities. Up and about more than 50% of waking hours)  3 - Symptomatic, >50% in bed, but not bedbound (Capable of only  limited self-care, confined to bed or chair 50% or more of waking hours)  4 - Bedbound (Completely disabled. Cannot carry on any self-care. Totally confined to bed or chair)  5 - Death   Amy Lucas MM, Creech RH, Tormey DC, et al. [phone]). "Toxicity and response criteria of the [facility]". Am. Amy Clines. Oncol. 5 (6): 649-55    LABORATORY DATA:  Lab Results  Component Value Date   WBC 7.4 12/20/2022   HGB 14.5 12/20/2022   HCT 43.1 12/20/2022   MCV 92.5 12/20/2022   PLT 191 12/20/2022   Lab Results  Component Value Date   NA 140  12/20/2022   K 3.8 12/20/2022   CL 106 12/20/2022   CO2 27 12/20/2022   Lab Results  Component Value Date   ALT 7 12/20/2022   AST 12 (L) 12/20/2022   ALKPHOS 50 12/20/2022   BILITOT 0.3 12/20/2022      RADIOGRAPHY: MM CLIP PLACEMENT RIGHT  Result Date: 12/18/2022 CLINICAL DATA:  Post biopsy mammogram of the right breast for clip placement. EXAM: 3D DIAGNOSTIC RIGHT MAMMOGRAM POST STEREOTACTIC BIOPSY COMPARISON:  Previous exam(s). FINDINGS: 3D Mammographic images were obtained following stereotactic guided biopsy of a mass in the lateral mid depth of the right breast. The biopsy marking clip is 1.3 cm medially displaced from the site of biopsy. IMPRESSION: The X shaped biopsy marking clip is approximately 1.3 cm medially displaced from the site of biopsy. Final Assessment: Post Procedure Mammograms for Marker Placement BI-RADS CATEGORY  80M: Post-Procedure Mammogram for Marker Placement Electronically Signed   By: Frederico Hamman M.D.   On: 12/18/2022 08:36   MM RT BREAST BX W LOC DEV 1ST LESION IMAGE BX SPEC STEREO GUIDE  Result Date: 12/18/2022 CLINICAL DATA:  77 year old female presenting for stereotactic biopsy of a right breast mass. EXAM: RIGHT BREAST STEREOTACTIC CORE NEEDLE BIOPSY COMPARISON:  Previous exam(s). FINDINGS: The patient and I discussed the procedure of stereotactic-guided biopsy including benefits and  alternatives. We discussed the high likelihood of a successful procedure. We discussed the risks of the procedure including infection, bleeding, tissue injury, clip migration, and inadequate sampling. Informed written consent was given. The usual time out protocol was performed immediately prior to the procedure. Using sterile technique and 1% Lidocaine as local anesthetic, under stereotactic guidance, a petite 9 gauge vacuum assisted device was used to perform core needle biopsy of a mass in the lateral mid depth of the right breast using a lateral approach. Lesion quadrant: Lower outer quadrant At the conclusion of the procedure, an X shaped tissue marker clip was deployed into the biopsy cavity. Follow-up 2-view mammogram was performed and dictated separately. IMPRESSION: Stereotactic-guided biopsy of a right breast mass in the lateral mid depth (X clip). No apparent complications. Electronically Signed   By: Frederico Hamman M.D.   On: 12/18/2022 08:31   [place] RT BREAST BX W LOC DEV 1ST LESION IMG BX SPEC US GUIDE  Addendum Date: 12/13/2022   ADDENDUM REPORT: 12/13/2022 16:28 ADDENDUM: Pathology revealed GRADE II INVASIVE LOBULAR CARCINOMA of the RIGHT breast, 11 o'clock, 5cmfn, (ribbon clip). This was found to be concordant by Dr. Emmaline Kluver. Pathology revealed BENIGN BREAST TISSUE WITH FIBROCYSTIC CHANGES INCLUDING STROMAL FIBROSIS, APOCRINE CYSTS, USUAL DUCTAL HYPERPLASIA AND FOCAL CHRONIC INFLAMMATION of the RIGHT breast, 12 o'clock, 3 cmfn, (coil clip). This was found to be concordant by Dr. Emmaline Kluver. Pathology results were discussed with the patient and her daughter, Verlee Monte, by telephone. The patient reported doing well after the biopsies with tenderness at the sites. Post biopsy instructions and care were reviewed and questions were answered. The patient was encouraged to call [facility] for any additional concerns. My direct phone number was  provided. The patient was [facility] to [facility] on December 20, 2022. Recommendation for a bilateral breast MRI given the lobular histology. The patient is scheduled for a RIGHT breast stereotactic guided biopsy on December 18, 2022. Further recommendations will be guided by the results of this biopsy. Pathology results reported by Rene Kocher, RN on 12/13/2022. Electronically Signed  By: Emmaline Kluver M.D.   On: 12/13/2022 16:28   Result Date: 12/13/2022 CLINICAL DATA:  77 year old female presenting for biopsy of masses in the right breast. EXAM: ULTRASOUND GUIDED RIGHT BREAST CORE NEEDLE BIOPSY x 2 COMPARISON:  Previous exam(s). PROCEDURE: I met with the patient and we discussed the procedure of ultrasound-guided biopsy, including benefits and alternatives. We discussed the high likelihood of a successful procedure. We discussed the risks of the procedure, including infection, bleeding, tissue injury, clip migration, and inadequate sampling. Informed written consent was given. The usual time-out protocol was performed immediately prior to the procedure. Lesion quadrant: Upper outer quadrant Using sterile technique and 1% Lidocaine as local anesthetic, under direct ultrasound visualization, a 14 gauge spring-loaded device was used to perform biopsy of a mass in the right breast at 11 o'clock using a lateral approach. At the conclusion of the procedure a ribbon shaped tissue marker clip was deployed into the biopsy cavity. Follow up 2 view mammogram was performed and dictated separately. Lesion quadrant: Upper outer quadrant Using sterile technique and 1% Lidocaine as local anesthetic, under direct ultrasound visualization, a 14 gauge spring-loaded device was used to perform biopsy of a mass in the right breast at 12 o'clock using a lateral approach. At the conclusion of the procedure coil shaped tissue marker clip was deployed into the  biopsy cavity. Follow up 2 view mammogram was performed and dictated separately. IMPRESSION: Ultrasound guided biopsy of masses in the right breast at 11 o'clock and 12 o'clock. No apparent complications. Electronically Signed: By: Emmaline Kluver M.D. On: 12/12/2022 10:43   [place] RT BREAST BX W LOC DEV EA ADD LESION IMG BX SPEC US GUIDE  Addendum Date: 12/13/2022   ADDENDUM REPORT: 12/13/2022 16:28 ADDENDUM: Pathology revealed GRADE II INVASIVE LOBULAR CARCINOMA of the RIGHT breast, 11 o'clock, 5cmfn, (ribbon clip). This was found to be concordant by Dr. Emmaline Kluver. Pathology revealed BENIGN BREAST TISSUE WITH FIBROCYSTIC CHANGES INCLUDING STROMAL FIBROSIS, APOCRINE CYSTS, USUAL DUCTAL HYPERPLASIA AND FOCAL CHRONIC INFLAMMATION of the RIGHT breast, 12 o'clock, 3 cmfn, (coil clip). This was found to be concordant by Dr. Emmaline Kluver. Pathology results were discussed with the patient and her daughter, Verlee Monte, by telephone. The patient reported doing well after the biopsies with tenderness at the sites. Post biopsy instructions and care were reviewed and questions were answered. The patient was encouraged to call [facility] for any additional concerns. My direct phone number was provided. The patient was [facility] to [facility] on December 20, 2022. Recommendation for a bilateral breast MRI given the lobular histology. The patient is scheduled for a RIGHT breast stereotactic guided biopsy on December 18, 2022. Further recommendations will be guided by the results of this biopsy. Pathology results reported by Rene Kocher, RN on 12/13/2022. Electronically Signed   By: Emmaline Kluver M.D.   On: 12/13/2022 16:28   Result Date: 12/13/2022 CLINICAL DATA:  77 year old female presenting for biopsy of masses in the right breast. EXAM: ULTRASOUND GUIDED RIGHT BREAST CORE NEEDLE BIOPSY x 2  COMPARISON:  Previous exam(s). PROCEDURE: I met with the patient and we discussed the procedure of ultrasound-guided biopsy, including benefits and alternatives. We discussed the high likelihood of a successful procedure. We discussed the risks of the procedure, including infection, bleeding, tissue injury, clip migration, and inadequate sampling. Informed written consent was given. The usual time-out protocol was performed immediately prior to the procedure.  Lesion quadrant: Upper outer quadrant Using sterile technique and 1% Lidocaine as local anesthetic, under direct ultrasound visualization, a 14 gauge spring-loaded device was used to perform biopsy of a mass in the right breast at 11 o'clock using a lateral approach. At the conclusion of the procedure a ribbon shaped tissue marker clip was deployed into the biopsy cavity. Follow up 2 view mammogram was performed and dictated separately. Lesion quadrant: Upper outer quadrant Using sterile technique and 1% Lidocaine as local anesthetic, under direct ultrasound visualization, a 14 gauge spring-loaded device was used to perform biopsy of a mass in the right breast at 12 o'clock using a lateral approach. At the conclusion of the procedure coil shaped tissue marker clip was deployed into the biopsy cavity. Follow up 2 view mammogram was performed and dictated separately. IMPRESSION: Ultrasound guided biopsy of masses in the right breast at 11 o'clock and 12 o'clock. No apparent complications. Electronically Signed: By: Emmaline Kluver M.D. On: 12/12/2022 10:43   MM CLIP PLACEMENT RIGHT  Result Date: 12/12/2022 CLINICAL DATA:  Post procedure mammogram for clip placement EXAM: 3D DIAGNOSTIC RIGHT MAMMOGRAM POST ULTRASOUND BIOPSY COMPARISON:  Previous exam(s). FINDINGS: 3D Mammographic images were obtained following ultrasound guided biopsy of a mass in the right breast at 11 o'clock. The ribbon biopsy marking clip is in expected position at the site of biopsy. 3D  Mammographic images were obtained following ultrasound guided biopsy of a mass in the right breast at 12 o'clock. The coil biopsy marking clip is in expected position at the site of biopsy. IMPRESSION: Appropriate positioning of the ribbon shaped biopsy marking clip at the site of biopsy in the right breast at 11 o'clock. Appropriate positioning of the coil shaped biopsy marking clip at the site of biopsy in the right breast at 12 o'clock. Please note the mass biopsied sonographically today at 12 o'clock does not correspond to the original mammographically identified mass. Therefore patient will be scheduled for additional stereotactic core biopsy for the third mass. Final Assessment: Post Procedure Mammograms for Marker Placement BI-RADS CATEGORY  43M: Post-Procedure Mammogram for Marker Placement Electronically Signed   By: Emmaline Kluver M.D.   On: 12/12/2022 10:40   MM 3D DIAGNOSTIC MAMMOGRAM UNILATERAL RIGHT BREAST  Result Date: 12/07/2022 CLINICAL DATA:  Recall from screening for possible right breast masses. EXAM: DIGITAL DIAGNOSTIC UNILATERAL RIGHT MAMMOGRAM WITH TOMOSYNTHESIS AND CAD; ULTRASOUND RIGHT BREAST LIMITED TECHNIQUE: Right digital diagnostic mammography and breast tomosynthesis was performed. The images were evaluated with computer-aided detection. ; Targeted ultrasound examination of the right breast was performed COMPARISON:  Previous exam(s). ACR Breast Density Category b: There are scattered areas of fibroglandular density. FINDINGS: In the central right breast, slightly towards the upper-outer quadrant, there is a spiculated 1 cm mass that persists on spot compression imaging. Anterior to this, there is a second smaller mass, more oval/elongated in configuration, measuring 8 mm in long axis. Targeted right breast ultrasound is performed, showing a hypoechoic mass in the right breast at 11 o'clock, 5 cm the nipple, middle to posterior depth, with ill-defined and partly irregular margins  and adjacent sonographic architectural distortion. This mass measures 1.1 x 1.0 x 1.0 cm, and is consistent in size and position to the spiculated mammographic mass. More anterior depth, at 12 o'clock, 3 cm the nipple, is an oval, parallel, mostly circumscribed bilobed appearing mass that measures 1.1 x 0.4 x 0.5 cm in size. This mass lies 2.5 cm anterior and medial to the larger mass seen at 11  o'clock. Sonographic imaging of the right axilla shows normal lymph nodes. There are no enlarged or abnormal lymph nodes. IMPRESSION: 1. 1.1 cm highly suspicious mass in the right breast at 11 o'clock, 5 cm the nipple. No right axillary lymphadenopathy. 2. 1.1 cm elongated, indeterminate mass in the right breast at 12 o'clock, 3 cm the nipple. RECOMMENDATION: 1. Ultrasound-guided core needle biopsy of the highly suspicious mass at 11 o'clock, 5 cm the nipple. 2. Ultrasound-guided core needle biopsy of the indeterminate mass in the right breast at 12 o'clock, 3 cm the nipple. These procedures were scheduled prior to the patient being discharged from the Breast Center. I have discussed the findings and recommendations with the patient. If applicable, a reminder letter will be sent to the patient regarding the next appointment. BI-RADS CATEGORY  5: Highly suggestive of malignancy. Electronically Signed   By: Amie Portland M.D.   On: 12/07/2022 16:07   [place] LIMITED ULTRASOUND INCLUDING AXILLA RIGHT BREAST  Result Date: 12/07/2022 CLINICAL DATA:  Recall from screening for possible right breast masses. EXAM: DIGITAL DIAGNOSTIC UNILATERAL RIGHT MAMMOGRAM WITH TOMOSYNTHESIS AND CAD; ULTRASOUND RIGHT BREAST LIMITED TECHNIQUE: Right digital diagnostic mammography and breast tomosynthesis was performed. The images were evaluated with computer-aided detection. ; Targeted ultrasound examination of the right breast was performed COMPARISON:  Previous exam(s). ACR Breast Density Category b: There are scattered areas of fibroglandular  density. FINDINGS: In the central right breast, slightly towards the upper-outer quadrant, there is a spiculated 1 cm mass that persists on spot compression imaging. Anterior to this, there is a second smaller mass, more oval/elongated in configuration, measuring 8 mm in long axis. Targeted right breast ultrasound is performed, showing a hypoechoic mass in the right breast at 11 o'clock, 5 cm the nipple, middle to posterior depth, with ill-defined and partly irregular margins and adjacent sonographic architectural distortion. This mass measures 1.1 x 1.0 x 1.0 cm, and is consistent in size and position to the spiculated mammographic mass. More anterior depth, at 12 o'clock, 3 cm the nipple, is an oval, parallel, mostly circumscribed bilobed appearing mass that measures 1.1 x 0.4 x 0.5 cm in size. This mass lies 2.5 cm anterior and medial to the larger mass seen at 11 o'clock. Sonographic imaging of the right axilla shows normal lymph nodes. There are no enlarged or abnormal lymph nodes. IMPRESSION: 1. 1.1 cm highly suspicious mass in the right breast at 11 o'clock, 5 cm the nipple. No right axillary lymphadenopathy. 2. 1.1 cm elongated, indeterminate mass in the right breast at 12 o'clock, 3 cm the nipple. RECOMMENDATION: 1. Ultrasound-guided core needle biopsy of the highly suspicious mass at 11 o'clock, 5 cm the nipple. 2. Ultrasound-guided core needle biopsy of the indeterminate mass in the right breast at 12 o'clock, 3 cm the nipple. These procedures were scheduled prior to the patient being discharged from the Breast Center. I have discussed the findings and recommendations with the patient. If applicable, a reminder letter will be sent to the patient regarding the next appointment. BI-RADS CATEGORY  5: Highly suggestive of malignancy. Electronically Signed   By: Amie Portland M.D.   On: 12/07/2022 16:07   MM 3D SCREENING MAMMOGRAM BILATERAL BREAST  Result Date: 11/30/2022 CLINICAL DATA:  Screening. EXAM:  DIGITAL SCREENING BILATERAL MAMMOGRAM WITH TOMOSYNTHESIS AND CAD TECHNIQUE: Bilateral screening digital craniocaudal and mediolateral oblique mammograms were obtained. Bilateral screening digital breast tomosynthesis was performed. The images were evaluated with computer-aided detection. COMPARISON:  Previous exam(s). ACR Breast Density Category b:  There are scattered areas of fibroglandular density. FINDINGS: In the right breast, a possible mass and separate possible focal asymmetry warrants further evaluation. In the left breast, no findings suspicious for malignancy. IMPRESSION: Further evaluation is suggested for a possible mass and separate possible focal asymmetry in the right breast. RECOMMENDATION: Diagnostic mammogram and possibly ultrasound of the right breast. (Code:[id number]) The patient will be contacted regarding the findings, and additional imaging will be scheduled. BI-RADS CATEGORY  0: Incomplete: Need additional imaging evaluation. Electronically Signed   By: Sherron Ales M.D.   On: 11/30/2022 12:15       IMPRESSION/PLAN: 1. Stage IA, [id number], grade 2, ER/PR positive invasive lobular carcinoma of the right breast. Dr. Mitzi Hansen discusses the pathology findings and reviews the nature of right breast disease. The consensus from the breast conference includes proceeding with MRI for extent of disease.  If her tumor was felt to be larger overall, then a course of neoadjuvant antiestrogen therapy might be considered versus proceeding if the tumor is not any larger directly to surgery with breast conservation with lumpectomy.  Dr. Mitzi Hansen discusses the role of  external radiotherapy to the breast  to reduce risks of local recurrence followed by antiestrogen therapy. Dr. Mitzi Hansen also discusses cases in which radiation may be optional for favorable cases based on final pathology.  We will follow-up with her final results of surgery, but if recommended Dr. Mitzi Hansen would anticipate a course of 4 weeks of  radiotherapy, and she might be interested in treatment in [place] and would lean toward being aggressive. 2. Possible genetic predisposition to malignancy. The patient is a candidate for genetic testing given her personal and family history. She will meet with our geneticist today in clinic.   In a visit lasting 60 minutes, greater than 50% of the time was spent face to face reviewing her case, as well as in preparation of, discussing, and coordinating the patient's care.  The above documentation reflects my direct findings during this shared patient visit. Please see the separate note by Dr. Mitzi Hansen on this date for the remainder of the patient's plan of care.    Osker Mason, [place] [facility]    **Disclaimer: This note was dictated with voice recognition software. Similar sounding words can inadvertently be transcribed and this note may contain transcription errors which may not have been corrected upon publication of note.**

## 2022-12-19 LAB — SURGICAL PATHOLOGY

## 2022-12-20 ENCOUNTER — Ambulatory Visit: Payer: Medicare Other | Attending: General Surgery | Admitting: Physical Therapy

## 2022-12-20 ENCOUNTER — Encounter: Payer: Self-pay | Admitting: Physical Therapy

## 2022-12-20 ENCOUNTER — Other Ambulatory Visit: Payer: Self-pay

## 2022-12-20 ENCOUNTER — Inpatient Hospital Stay: Payer: Medicare Other | Admitting: Genetic Counselor

## 2022-12-20 ENCOUNTER — Encounter: Payer: Self-pay | Admitting: General Practice

## 2022-12-20 ENCOUNTER — Ambulatory Visit
Admission: RE | Admit: 2022-12-20 | Discharge: 2022-12-20 | Disposition: A | Discharge status: Home / Self Care | Payer: Medicare Other | Source: Ambulatory Visit | Attending: Radiation Oncology | Admitting: Radiation Oncology

## 2022-12-20 ENCOUNTER — Encounter: Payer: Self-pay | Admitting: Genetic Counselor

## 2022-12-20 ENCOUNTER — Inpatient Hospital Stay: Payer: Medicare Other | Admitting: Hematology and Oncology

## 2022-12-20 ENCOUNTER — Encounter: Payer: Self-pay | Admitting: *Deleted

## 2022-12-20 ENCOUNTER — Inpatient Hospital Stay: Payer: Medicare Other | Attending: Hematology and Oncology

## 2022-12-20 ENCOUNTER — Encounter: Payer: Self-pay | Admitting: Hematology and Oncology

## 2022-12-20 VITALS — BP 128/85 | HR 97 | Temp 97.2°F | Resp 18 | Ht 62.0 in | Wt 128.8 lb

## 2022-12-20 DIAGNOSIS — Z170 Estrogen receptor positive status [ER+]: Secondary | ICD-10-CM | POA: Insufficient documentation

## 2022-12-20 DIAGNOSIS — C50411 Malignant neoplasm of upper-outer quadrant of right female breast: Secondary | ICD-10-CM

## 2022-12-20 DIAGNOSIS — R293 Abnormal posture: Secondary | ICD-10-CM | POA: Diagnosis present

## 2022-12-20 DIAGNOSIS — Z803 Family history of malignant neoplasm of breast: Secondary | ICD-10-CM

## 2022-12-20 DIAGNOSIS — Z8041 Family history of malignant neoplasm of ovary: Secondary | ICD-10-CM

## 2022-12-20 LAB — CMP (CANCER CENTER ONLY)
ALT: 7 U/L (ref 0–44)
AST: 12 U/L — ABNORMAL LOW (ref 15–41)
Albumin: 4.1 g/dL (ref 3.5–5.0)
Alkaline Phosphatase: 50 U/L (ref 38–126)
Anion gap: 7 (ref 5–15)
BUN: 8 mg/dL (ref 8–23)
CO2: 27 mmol/L (ref 22–32)
Calcium: 9.2 mg/dL (ref 8.9–10.3)
Chloride: 106 mmol/L (ref 98–111)
Creatinine: 0.82 mg/dL (ref 0.44–1.00)
GFR, Estimated: >60 mL/min (ref >60)
Glucose, Bld: 113 mg/dL — ABNORMAL HIGH (ref 70–99)
Potassium: 3.8 mmol/L (ref 3.5–5.1)
Sodium: 140 mmol/L (ref 135–145)
Total Bilirubin: 0.3 mg/dL (ref 0.3–1.2)
Total Protein: 7.2 g/dL (ref 6.5–8.1)

## 2022-12-20 LAB — CBC WITH DIFFERENTIAL (CANCER CENTER ONLY)
Abs Immature Granulocytes: 0.02 10*3/uL (ref 0.00–0.07)
Basophils Absolute: 0 10*3/uL (ref 0.0–0.1)
Basophils Relative: 1 %
Eosinophils Absolute: 0.1 10*3/uL (ref 0.0–0.5)
Eosinophils Relative: 1 %
HCT: 43.1 % (ref 36.0–46.0)
Hemoglobin: 14.5 g/dL (ref 12.0–15.0)
Immature Granulocytes: 0 %
Lymphocytes Relative: 33 %
Lymphs Abs: 2.4 10*3/uL (ref 0.7–4.0)
MCH: 31.1 pg (ref 26.0–34.0)
MCHC: 33.6 g/dL (ref 30.0–36.0)
MCV: 92.5 fL (ref 80.0–100.0)
Monocytes Absolute: 0.6 10*3/uL (ref 0.1–1.0)
Monocytes Relative: 8 %
Neutro Abs: 4.3 10*3/uL (ref 1.7–7.7)
Neutrophils Relative %: 57 %
Platelet Count: 191 10*3/uL (ref 150–400)
RBC: 4.66 MIL/uL (ref 3.87–5.11)
RDW: 12.7 % (ref 11.5–15.5)
WBC Count: 7.4 10*3/uL (ref 4.0–10.5)
nRBC: 0 % (ref 0.0–0.2)

## 2022-12-20 LAB — GENETIC SCREENING ORDER

## 2022-12-20 MED ORDER — ANASTROZOLE 1 MG PO TABS: 1.0000 mg | ORAL_TABLET | Freq: Every day | ORAL | 3 refills | Status: DC

## 2022-12-20 NOTE — Therapy (Signed)
OUTPATIENT PHYSICAL THERAPY BREAST CANCER BASELINE EVALUATION   Patient Name: Amy Lucas MRN: [id number] DOB:1945/08/06, 77 y.o., female Today's Date: 12/20/2022  END OF SESSION:  PT End of Session - 12/20/22 1519     Visit Number 1    Number of Visits 2    Date for PT Re-Evaluation 02/14/23    PT Start Time 1344    PT Stop Time 1405   Also saw pt from [phone] for a total of 31 min   PT Time Calculation (min) 21 min    Activity Tolerance Patient tolerated treatment well    Behavior During Therapy WFL for tasks assessed/performed             Past Medical History:  Diagnosis Date   Anxiety    Bladder infection    Blood type, Rh negative    Breast cancer (HCC)    COPD (chronic obstructive pulmonary disease) (HCC)    Hernia    High cholesterol    History of chicken pox    History of measles    Hypertension    Ovarian cyst    Vaginal dryness    Past Surgical History:  Procedure Laterality Date   BREAST BIOPSY Right 12/12/2022   [place] RT BREAST BX W LOC DEV 1ST LESION IMG BX SPEC US GUIDE 12/12/2022 [facility]   BREAST BIOPSY Right 12/12/2022   [place] RT BREAST BX W LOC DEV EA ADD LESION IMG BX SPEC US GUIDE 12/12/2022 [facility]   BREAST BIOPSY Right 12/18/2022   MM RT BREAST BX W LOC DEV 1ST LESION IMAGE BX SPEC STEREO GUIDE 12/18/2022 [facility]   HEMORRHOID SURGERY     NASAL SINUS SURGERY     TRIGGER FINGER RELEASE     Patient Active Problem List   Diagnosis Date Noted   Malignant neoplasm of upper-outer quadrant of right breast in female, estrogen receptor positive (HCC) 12/18/2022   Atypical chest pain 05/03/2017   Supraventricular tachycardia (HCC) 04/02/2017   GERD (gastroesophageal reflux disease) 12/22/2016   Chronic nonseasonal allergic rhinitis due to pollen 05/05/2016   B-complex deficiency 04/30/2015   Recurrent major depressive disorder (HCC) 04/30/2015   Vaginal dryness    Bladder infection    COPD (chronic obstructive  pulmonary disease) (HCC)    High cholesterol    Ovarian cyst    Blood type, Rh negative    Hernia    Anxiety     REFERRING PROVIDER: Dr. Emelia Loron  REFERRING DIAG: Right breast cancer  THERAPY DIAG:  Malignant neoplasm of upper-outer quadrant of right breast in female, estrogen receptor positive (HCC)  Abnormal posture  Rationale for Evaluation and Treatment: Rehabilitation  ONSET DATE: 11/29/2022  SUBJECTIVE:  SUBJECTIVE STATEMENT: Patient reports she is here today to be seen by her medical team for her newly diagnosed right breast cancer.   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 It measures 1.1 cm and is located in the upper outer quadrant. It is ER/PR positive and HER2 negative with a Ki67 of 3%.   PATIENT GOALS:   reduce lymphedema risk and learn post op HEP.   PAIN:  Are you having pain? No  PRECAUTIONS: Active CA   RED FLAGS: None   HAND DOMINANCE: right  WEIGHT BEARING RESTRICTIONS: No  FALLS:  Has patient fallen in last 6 months? No  LIVING ENVIRONMENT: Patient lives with: alone but in an attached apartment to her son's house Lives in: House/apartment Has following equipment at home: None  OCCUPATION: Retired  LEISURE: She does not exercise; smokes 1.5 packs per day  PRIOR LEVEL OF FUNCTION: Independent   OBJECTIVE: Note: Objective measures were completed at Evaluation unless otherwise noted.  COGNITION: Overall cognitive status: Within functional limits for tasks assessed    POSTURE:  Forward head and rounded shoulders posture  UPPER EXTREMITY AROM/PROM:  A/PROM RIGHT   eval   Shoulder extension 51  Shoulder flexion 166  Shoulder abduction 164  Shoulder internal  rotation 87  Shoulder external rotation 83    (Blank rows = not tested)  A/PROM LEFT   eval  Shoulder extension 53  Shoulder flexion 153  Shoulder abduction 168  Shoulder internal rotation 76  Shoulder external rotation 70    (Blank rows = not tested)  CERVICAL AROM: All within normal limits  UPPER EXTREMITY STRENGTH: WNL  LYMPHEDEMA ASSESSMENTS (in cm):   LANDMARK RIGHT   eval  10 cm proximal to olecranon process 26  Olecranon process 22.2  10 cm proximal to ulnar styloid process 16.8  Just proximal to ulnar styloid process 13.7  Across hand at thumb web space 16.1  At base of 2nd digit 5.3  (Blank rows = not tested)  LANDMARK LEFT   eval  10 cm proximal to olecranon process 25.8  Olecranon process 21.3  10 cm proximal to ulnar styloid process 16.4  Just proximal to ulnar styloid process 132  Across hand at thumb web space 15.4  At base of 2nd digit 5.1  (Blank rows = not tested)  L-DEX LYMPHEDEMA SCREENING:  The patient was assessed using the L-Dex machine today to produce a lymphedema index baseline score. The patient will be reassessed on a regular basis (typically every 3 months) to obtain new L-Dex scores. If the score is > 6.5 points away from his/her baseline score indicating onset of subclinical lymphedema, it will be recommended to wear a compression garment for 4 weeks, 12 hours per day and then be reassessed. If the score continues to be > 6.5 points from baseline at reassessment, we will initiate lymphedema treatment. Assessing in this manner has a 95% rate of preventing clinically significant lymphedema.   L-DEX FLOWSHEETS - 12/20/22 1500       L-DEX LYMPHEDEMA SCREENING   Measurement Type Unilateral    L-DEX MEASUREMENT EXTREMITY Upper Extremity    POSITION  Standing    DOMINANT SIDE Right    At Risk Side Right    BASELINE SCORE (UNILATERAL) 9.5             QUICK DASH SURVEY:  Neldon Mc - 12/20/22 0001     Open a tight or new jar Mild  difficulty    Do heavy household chores (wash walls, wash floors) No difficulty    Carry a shopping bag or briefcase No difficulty    Wash your back No difficulty    Use a knife to cut food No difficulty    Recreational activities in which you take some force or impact through your arm, shoulder, or hand (golf, hammering, tennis) Mild difficulty    During the past week, to what extent has your arm, shoulder or hand problem interfered with your normal social activities with family, friends, neighbors, or groups? Not at all    During the past week, to what extent has your arm, shoulder or hand problem limited your work or other regular daily activities Not at all    Arm, shoulder, or hand pain. None    Tingling (pins and needles) in your arm, shoulder, or hand None    Difficulty Sleeping No difficulty    DASH Score 4.55 %              PATIENT EDUCATION:  Education details: Lymphedema risk reduction and post op shoulder/posture HEP Person educated: Patient Education method: Explanation, Demonstration, Handout Education comprehension: Patient verbalized understanding and returned demonstration  HOME EXERCISE PROGRAM: Patient was instructed today in a home exercise program today for post op shoulder range of motion. These included active assist shoulder flexion in sitting, scapular retraction, wall walking with shoulder abduction, and hands behind head external rotation.  She was encouraged to do these twice a day, holding 3 seconds and repeating 5 times when permitted by her physician.   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 It measures 1.1 cm and is located in the upper outer quadrant. It is ER/PR positive and HER2 negative with a Ki67 of 3%. Her multidisciplinary medical team met prior to her assessments to determine a recommended treatment plan. She is planning to have an MRI, a right  lumpectomy and sentinel node biopsy followed by possible radiation and anti-estrogen therapy. She will benefit from a post op PT reassessment to determine needs and from L-Dex screens every 3 months for 2 years to detect subclinical lymphedema.  Pt will benefit from skilled therapeutic intervention to improve on the following deficits: Decreased knowledge of precautions, impaired UE functional use, pain, decreased ROM, postural dysfunction.   PT treatment/interventions: ADL/self-care home management, pt/family education, therapeutic exercise  REHAB POTENTIAL: Excellent  CLINICAL DECISION MAKING: Stable/uncomplicated  EVALUATION COMPLEXITY: Low   GOALS: Goals reviewed with patient? YES  LONG TERM GOALS: (STG=LTG)    Name Target Date Goal status  1 Pt will be able to verbalize understanding of pertinent lymphedema risk reduction practices relevant to her dx specifically related to skin care.  Baseline:  No knowledge 12/20/2022 Achieved at eval  2 Pt will be able to return demo and/or verbalize understanding of the post op HEP related to regaining shoulder ROM. Baseline:  No knowledge 12/20/2022 Achieved at eval  3 Pt will be able to verbalize understanding of the importance of attending the post op After Breast CA Class for further lymphedema risk reduction education and therapeutic exercise.  Baseline:  No knowledge 12/20/2022 Achieved at eval  4 Pt will demo she has regained full shoulder ROM and function post operatively compared to baselines.  Baseline: See objective measurements taken today. 02/14/2023     PLAN:  PT FREQUENCY/DURATION: EVAL and 1 follow up appointment.   PLAN FOR NEXT SESSION: will reassess  3-4 weeks post op to determine needs.   Patient will follow up at outpatient cancer rehab 3-4 weeks following surgery.  If the patient requires physical therapy at that time, a specific plan will be dictated and sent to the referring physician for approval. The patient was  educated today on appropriate basic range of motion exercises to begin post operatively and the importance of attending the After Breast Cancer class following surgery.  Patient was educated today on lymphedema risk reduction practices as it pertains to recommendations that will benefit the patient immediately following surgery.  She verbalized good understanding.    Physical Therapy Information for After Breast Cancer Surgery/Treatment:  Lymphedema is a swelling condition that you may be at risk for in your arm if you have lymph nodes removed from the armpit area.  After a sentinel node biopsy, the risk is approximately 5-9% and is higher after an axillary node dissection.  There is treatment available for this condition and it is not life-threatening.  Contact your physician or physical therapist with concerns. You may begin the 4 shoulder/posture exercises (see additional sheet) when permitted by your physician (typically a week after surgery).  If you have drains, you may need to wait until those are removed before beginning range of motion exercises.  A general recommendation is to not lift your arms above shoulder height until drains are removed.  These exercises should be done to your tolerance and gently.  This is not a "no pain/no gain" type of recovery so listen to your body and stretch into the range of motion that you can tolerate, stopping if you have pain.  If you are having immediate reconstruction, ask your plastic surgeon about doing exercises as he or she may want you to wait. We encourage you to attend the free one time ABC (After Breast Cancer) class offered by [facility].  You will learn information related to lymphedema risk, prevention and treatment and additional exercises to regain mobility following surgery.  You can call [phone] for more information.  This is offered the 1st and 3rd Monday of each month.  You only attend the class one time. While  undergoing any medical procedure or treatment, try to avoid blood pressure being taken or needle sticks from occurring on the arm on the side of cancer.   This recommendation begins after surgery and continues for the rest of your life.  This may help reduce your risk of getting lymphedema (swelling in your arm). An excellent resource for those seeking information on lymphedema is the National Lymphedema Network's web site. It can be accessed at www.lymphnet.org If you notice swelling in your hand, arm or breast at any time following surgery (even if it is many years from now), please contact your doctor or physical therapist to discuss this.  Lymphedema can be treated at any time but it is easier for you if it is treated early on.  If you feel like your shoulder motion is not returning to normal in a reasonable amount of time, please contact your surgeon or physical therapist.  [facility] [phone]. [address], [address], [place] [address]  ABC CLASS After Breast Cancer Class  After Breast Cancer Class is a specially designed exercise class to assist you in a safe recover after having breast cancer surgery.  In this class you will learn how to get back to full function whether your drains were just removed or if you had  surgery a month ago.  This one-time class is held the 1st and 3rd Monday of every month from 11:00 a.m. until 12:00 noon virtually.  This class is FREE and space is limited. For more information or to register for the next available class, call [phone].  Class Goals  Understand specific stretches to improve the flexibility of you chest and shoulder. Learn ways to safely strengthen your upper body and improve your posture. Understand the warning signs of infection and why you may be at risk for an arm infection. Learn about Lymphedema and prevention.  ** You do not attend this class until after surgery.  Drains must be removed to  participate  Patient was instructed today in a home exercise program today for post op shoulder range of motion. These included active assist shoulder flexion in sitting, scapular retraction, wall walking with shoulder abduction, and hands behind head external rotation.  She was encouraged to do these twice a day, holding 3 seconds and repeating 5 times when permitted by her physician.  Bethann Punches, [place] 12/20/22 3:26 PM

## 2022-12-20 NOTE — Progress Notes (Signed)
Amy Lucas CONSULT NOTE  Patient Care Team: Gordan Payment., MD as PCP - General (Internal Medicine) Pershing Proud, RN as Registered Nurse Donnelly Angelica, RN as Registered Nurse Emelia Loron, MD as Consulting Physician (General Surgery) Serena Croissant, MD as Consulting Physician (Hematology and Oncology) Dorothy Puffer, MD as Consulting Physician (Radiation Oncology)  CHIEF COMPLAINTS/PURPOSE OF CONSULTATION:  Newly diagnosed breast cancer  HISTORY OF PRESENTING ILLNESS:  Ms. Amy Lucas is a 77 year old above-mentioned history of screening mammogram detected right breast mass and asymmetry lower o'clock measuring 1.1 cm.  Stereotactic biopsy revealed a grade 2 invasive lobular cancer that is ER 100% PR 100% Ki67 5% HER2 negative with a additional biopsy initially was benign but the third biopsy was performed which revealed a grade 2 invasive lobular cancer and axilla was negative.  She was presented this morning to the multidisciplinary tumor board and she is here today to discuss treatment plan.   I reviewed her records extensively and collaborated the history with the patient.  SUMMARY OF ONCOLOGIC HISTORY: Oncology History  Malignant neoplasm of upper-outer quadrant of right breast in female, estrogen receptor positive (HCC)  12/18/2022 Initial Diagnosis   Screening mammogram detected right breast mass and asymmetry 11:00: 1.1 cm: Stereotactic biopsy: Grade 2 ILC ER 100%, PR 100%, Ki67 3%, HER2 negative, 12 o'clock position: 1.1 cm: Biopsy benign concordant, third biopsy also positive for grade 2 invasive lobular cancer, axilla negative   12/18/2022 Cancer Staging   Staging form: Breast, AJCC 8th Edition - Clinical stage from 12/18/2022: Stage IA (cT1c, cN0, cM0, G2, ER+, PR+, HER2-) - Signed by Ronny Bacon, PA-C on 12/18/2022 Stage prefix: Initial diagnosis Method of lymph node assessment: Clinical Histologic grading system: 3 grade system       MEDICAL HISTORY:  Past Medical History:  Diagnosis Date   Anxiety    Bladder infection    Blood type, Rh negative    Breast cancer (HCC)    COPD (chronic obstructive pulmonary disease) (HCC)    Hernia    High cholesterol    History of chicken pox    History of measles    Hypertension    Ovarian cyst    Vaginal dryness     SURGICAL HISTORY: Past Surgical History:  Procedure Laterality Date   BREAST BIOPSY Right 12/12/2022   [place] RT BREAST BX W LOC DEV 1ST LESION IMG BX SPEC US GUIDE 12/12/2022 [facility]   BREAST BIOPSY Right 12/12/2022   [place] RT BREAST BX W LOC DEV EA ADD LESION IMG BX SPEC US GUIDE 12/12/2022 [facility]   BREAST BIOPSY Right 12/18/2022   MM RT BREAST BX W LOC DEV 1ST LESION IMAGE BX SPEC STEREO GUIDE 12/18/2022 [facility]   HEMORRHOID SURGERY     NASAL SINUS SURGERY     TRIGGER FINGER RELEASE      SOCIAL HISTORY: Social History   Socioeconomic History   Marital status: Married    Spouse name: Not on file   Number of children: Not on file   Years of education: Not on file   Highest education level: Not on file  Occupational History   Not on file  Tobacco Use   Smoking status: Every Day    Current packs/day: 1.50    Types: Cigarettes   Smokeless tobacco: Never  Vaping Use   Vaping status: Every Day  Substance and Sexual Activity   Alcohol use: Yes   Drug use: No   Sexual activity: Yes  Birth control/protection: Post-menopausal  Other Topics Concern   Not on file  Social History Narrative   Not on file   Social Determinants of Health   Financial Resource Strain: Not on file  Food Insecurity: Not on file  Transportation Needs: Not on file  Physical Activity: Not on file  Stress: Not on file  Social Connections: Not on file  Intimate Partner Violence: Not on file    FAMILY HISTORY: Family History  Problem Relation Age of Onset   Heart disease Mother    Hypertension Mother    Heart failure Mother     Breast cancer Maternal Grandmother    Breast cancer Maternal Aunt     ALLERGIES:  is allergic to sulfa antibiotics and triamcinolone acetonide.  MEDICATIONS:  Current Outpatient Medications  Medication Sig Dispense Refill   ALPRAZolam (XANAX) 1 MG tablet Take 1 mg by mouth at bedtime as needed for anxiety. Take 1-4 tablets daily as needed     amLODipine (NORVASC) 10 MG tablet Take 2.5 mg by mouth daily.     anastrozole (ARIMIDEX) 1 MG tablet Take 1 tablet (1 mg total) by mouth daily. 90 tablet 3   ibandronate (BONIVA) 150 MG tablet TK 1 T PO Q MONTH     simvastatin (ZOCOR) 40 MG tablet TK 1 T PO QHS     albuterol (PROAIR HFA) 108 (90 Base) MCG/ACT inhaler INHALE 2 PUFFS BY MOUTH EVERY 4 HOURS AS NEEDED (Patient not taking: Reported on 12/20/2022)     diclofenac sodium (VOLTAREN) 1 % GEL Apply bid as needed to painful joints 1gm (Patient not taking: Reported on 12/20/2022)     Fluticasone-Salmeterol (ADVAIR) 250-50 MCG/DOSE AEPB Inhale into the lungs. (Patient not taking: Reported on 12/20/2022)     loratadine-pseudoephedrine (CLARITIN-D 24 HOUR) 10-240 MG 24 hr tablet TK 1 T PO QD (Patient not taking: Reported on 12/20/2022)     meclizine (ANTIVERT) 25 MG tablet Take 1 tablet (25 mg total) by mouth 3 (three) times daily as needed for dizziness. (Patient not taking: Reported on 12/20/2022) 30 tablet 0   omeprazole (PRILOSEC) 40 MG capsule TK 1 C PO 20 MIN B BRE (Patient not taking: Reported on 12/20/2022)     ondansetron (ZOFRAN ODT) 4 MG disintegrating tablet Take 1 tablet (4 mg total) by mouth every 8 (eight) hours as needed for nausea or vomiting. (Patient not taking: Reported on 12/20/2022) 20 tablet 0   polyethylene glycol powder (GLYCOLAX/MIRALAX) powder  (Patient not taking: Reported on 12/20/2022)     No current facility-administered medications for this visit.    REVIEW OF SYSTEMS:   Constitutional: Denies fevers, chills or abnormal night sweats   All other systems were reviewed with the  patient and are negative.  PHYSICAL EXAMINATION: ECOG PERFORMANCE STATUS: 1 - Symptomatic but completely ambulatory  Vitals:   12/20/22 1229  BP: 128/85  Pulse: 97  Resp: 18  Temp: (!) 97.2 F (36.2 C)  SpO2: 94%   Filed Weights   12/20/22 1229  Weight: 128 lb 12.8 oz (58.4 kg)    GENERAL:alert, no distress and comfortable    LABORATORY DATA:  I have reviewed the data as listed Lab Results  Component Value Date   WBC 7.4 12/20/2022   HGB 14.5 12/20/2022   HCT 43.1 12/20/2022   MCV 92.5 12/20/2022   PLT 191 12/20/2022   Lab Results  Component Value Date   NA 140 12/20/2022   K 3.8 12/20/2022   CL 106 12/20/2022  CO2 27 12/20/2022    RADIOGRAPHIC STUDIES: I have personally reviewed the radiological reports and agreed with the findings in the report.  ASSESSMENT AND PLAN:  Malignant neoplasm of upper-outer quadrant of right breast in female, estrogen receptor positive (HCC) 12/18/2022: Screening mammogram detected right breast mass and asymmetry 11:00: 1.1 cm: Stereotactic biopsy: Grade 2 ILC ER 100%, PR 100%, Ki67 3%, HER2 negative, 12 o'clock position: 1.1 cm: Biopsy benign concordant, third biopsy also positive for grade 2 invasive lobular cancer, axilla negative  Pathology and radiology counseling:Discussed with the patient, the details of pathology including the type of breast cancer,the clinical staging, the significance of ER, PR and HER-2/neu receptors and the implications for treatment. After reviewing the pathology in detail, we proceeded to discuss the different treatment options between surgery, radiation, chemotherapy, antiestrogen therapies.  Recommendations: 1. Breast conserving surgery followed by 2. Adjuvant radiation therapy followed by 3. Adjuvant antiestrogen therapy   Return to clinic after surgery to discuss final pathology report      All questions were answered. The patient knows to call the clinic with any problems, questions or  concerns.    Tamsen Meek, MD 12/20/22

## 2022-12-20 NOTE — Progress Notes (Signed)
[facility] Spiritual Care Note  Met with Amy Lucas and her former daughter-in-law, with whom she is very close, in Breast Multidisciplinary Clinic to introduce Support Center team/resources.  She completed SDOH screening; results follow below.    SDOH Screenings   Food Insecurity: No Food Insecurity (12/20/2022)  Transportation Needs: No Transportation Needs (12/20/2022)  Utilities: Not At Risk (12/20/2022)  Depression (PHQ2-9): Medium Risk (12/20/2022)  Tobacco Use: High Risk (12/20/2022)    Chaplain and patient discussed common feelings and emotions when being diagnosed with cancer, and the importance of support during treatment.  Chaplain informed patient of the support team and support services at [facility].  Chaplain provided contact information and encouraged patient to call with any questions or concerns.  Amy Lucas is interested in support programming. She notes that she regularly experiences anxiety and some depression; she has an appointment with her PCP next week to address these.  Follow up needed: No. Amy Lucas plans to phone as needed/desired for follow-up support.   [address] [place], [place], [facility] Pager [phone] Voicemail [phone]

## 2022-12-20 NOTE — Progress Notes (Signed)
REFERRING PROVIDER: Serena Croissant, MD [address] [place],  [place] [address]   PRIMARY PROVIDER:  Gordan Payment., MD  PRIMARY REASON FOR VISIT:  1. Malignant neoplasm of upper-outer quadrant of right breast in female, estrogen receptor positive (HCC)   2. Family history of breast cancer   3. Family history of ovarian cancer     HISTORY OF PRESENT ILLNESS:   Amy Lucas, a 77 y.o. female, was seen for a [place] cancer genetics consultation at the request of Dr. Pamelia Hoit due to a personal and family history of breast cancer.  Amy Lucas presents to clinic today to discuss the possibility of a hereditary predisposition to cancer, to discuss genetic testing, and to further clarify her future cancer risks, as well as potential cancer risks for family members.   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 The preliminary treatment plan includes breast conserving surgery, adjuvant radiation, and anti-estrogens.   CANCER HISTORY:  Oncology History  Malignant neoplasm of upper-outer quadrant of right breast in female, estrogen receptor positive (HCC)  12/18/2022 Initial Diagnosis   Screening mammogram detected right breast mass and asymmetry 11:00: 1.1 cm: Stereotactic biopsy: Grade 2 ILC ER 100%, PR 100%, Ki67 3%, HER2 negative, 12 o'clock position: 1.1 cm: Biopsy benign concordant, third biopsy also positive for grade 2 invasive lobular cancer, axilla negative   12/18/2022 Cancer Staging   Staging form: Breast, AJCC 8th Edition - Clinical stage from 12/18/2022: Stage IA (cT1c, cN0, cM0, G2, ER+, PR+, HER2-) - Signed by Ronny Bacon, PA-C on 12/18/2022 Stage prefix: Initial diagnosis Method of lymph node assessment: Clinical Histologic grading system: 3 grade system      Past Medical History:  Diagnosis Date   Anxiety    Bladder infection    Blood type, Rh negative    Breast  cancer (HCC)    COPD (chronic obstructive pulmonary disease) (HCC)    Hernia    High cholesterol    History of chicken pox    History of measles    Hypertension    Ovarian cyst    Vaginal dryness     Past Surgical History:  Procedure Laterality Date   BREAST BIOPSY Right 12/12/2022   [place] RT BREAST BX W LOC DEV 1ST LESION IMG BX SPEC US GUIDE 12/12/2022 [facility]   BREAST BIOPSY Right 12/12/2022   [place] RT BREAST BX W LOC DEV EA ADD LESION IMG BX SPEC US GUIDE 12/12/2022 [facility]   BREAST BIOPSY Right 12/18/2022   MM RT BREAST BX W LOC DEV 1ST LESION IMAGE BX SPEC STEREO GUIDE 12/18/2022 [facility]   HEMORRHOID SURGERY     NASAL SINUS SURGERY     TRIGGER FINGER RELEASE      FAMILY HISTORY:  We obtained a detailed, 4-generation family history.  Significant diagnoses are listed below: Family History  Problem Relation Age of Onset   Breast cancer Maternal Aunt        dx 30s-40s   Ovarian cancer Maternal Aunt        d. 77s   Brain cancer Maternal Aunt        d. 50s-60s   Melanoma Maternal Uncle        scalp; mets; d. >50   Breast cancer Maternal Grandmother        dx >50     Amy Lucas is unaware of previous family history of genetic testing  for hereditary cancer risks. She does not have any information about her father/paternal relatives. There is no reported Ashkenazi Jewish ancestry.   GENETIC COUNSELING ASSESSMENT: Amy Lucas is a 77 y.o. female with a personal and family which is somewhat suggestive of a hereditary cancer syndrome and predisposition to cancer given the presence of related cancers in the family (breast, ovarian, melanoma). We, therefore, discussed and recommended the following at today's visit.   DISCUSSION: We discussed that 5 - 10% of cancer is hereditary.  Most cases of hereditary breast and ovarian cancer are associated with mutations in BRCA1/2.  There are other genes that can be associated with hereditary breast, ovarian, or  melanoma cancer syndromes.  We discussed that testing is beneficial for several reasons including knowing how to follow individuals for their cancer risks and understanding if other family members could be at risk for cancer and allowing them to undergo genetic testing.   We reviewed the characteristics, features and inheritance patterns of hereditary cancer syndromes. We also discussed genetic testing, including the appropriate family members to test, the process of testing, insurance coverage and turn-around-time for results. We discussed the implications of a negative, positive, carrier and/or variant of uncertain significant result. We recommended Amy Lucas pursue genetic testing for a panel that includes genes associated with breast, ovarian, melanoma, and other cancers.   The CancerNext-Expanded gene panel offered by [facility] and includes sequencing, rearrangement, and RNA analysis for the following 71 genes:  AIP, ALK, APC, ATM, BAP1, BARD1, BMPR1A, BRCA1, BRCA2, BRIP1, CDC73, CDH1, CDK4, CDKN1B, CDKN2A, CHEK2, DICER1, FH, FLCN, KIF1B, LZTR1,MAX, MEN1, MET, MLH1, MSH2, MSH6, MUTYH, NF1, NF2, NTHL1, PALB2, PHOX2B, PMS2, POT1, PRKAR1A, PTCH1, PTEN, RAD51C,RAD51D, RB1, RET, SDHA, SDHAF2, SDHB, SDHC, SDHD, SMAD4, SMARCA4, SMARCB1, SMARCE1, STK11, SUFU, TMEM127, TP53,TSC1, TSC2 and VHL (sequencing and deletion/duplication); AXIN2, CTNNA1, EGFR, EGLN1, HOXB13, KIT, MITF, MSH3, PDGFRA, POLD1 and POLE (sequencing only); EPCAM and GREM1 (deletion/duplication only).   Based on Amy Lucas's personal history of breast cancer and family history of breast cancer in 2 close relatives on the same side of the family, she meets medical criteria for genetic testing. Despite that she meets criteria, she may still have an out of pocket cost. We discussed that if her out of pocket cost for testing is over $100, the laboratory should contact her and discuss the self-pay prices and/or patient pay assistance programs.     PLAN: After considering the risks, benefits, and limitations, Amy Lucas provided informed consent to pursue genetic testing and the blood sample was sent to [facility] for analysis of the CancerNext-Expanded +RNAinsight Panel. Results should be available within approximately 2-3 weeks' time, at which point they will be disclosed by telephone to Amy Lucas, as will any additional recommendations warranted by these results. Amy Lucas will receive a summary of her genetic counseling visit and a copy of her results once available. This information will also be available in Epic.   Amy Lucas questions were answered to her satisfaction today. Our contact information was provided should additional questions or concerns arise. Thank you for the referral and allowing [place] to share in the care of your patient.   Amy Lucas M. Rennie Plowman, MS, [facility] Genetic Counselor [email or web address] (P) [phone]  The patient was seen for a total of 20 minutes in face-to-face genetic counseling.  The patient was accompanied by her daughter, Amy Lucas.  Dr. Pamelia Hoit available to discuss this case as needed.    _______________________________________________________________________ For Office Staff:  Number of people involved in  session: 2 Was an Intern/ student involved with case: no

## 2022-12-20 NOTE — Assessment & Plan Note (Signed)
12/18/2022: Screening mammogram detected right breast mass and asymmetry 11:00: 1.1 cm: Stereotactic biopsy: Grade 2 ILC ER 100%, PR 100%, Ki67 3%, HER2 negative, 12 o'clock position: 1.1 cm: Biopsy benign concordant, third biopsy also positive for grade 2 invasive lobular cancer, axilla negative  Pathology and radiology counseling:Discussed with the patient, the details of pathology including the type of breast cancer,the clinical staging, the significance of ER, PR and HER-2/neu receptors and the implications for treatment. After reviewing the pathology in detail, we proceeded to discuss the different treatment options between surgery, radiation, chemotherapy, antiestrogen therapies.  Recommendations: 1. Breast conserving surgery followed by 2. Adjuvant radiation therapy followed by 3. Adjuvant antiestrogen therapy   Return to clinic after surgery to discuss final pathology report and then determine if Oncotype DX testing will need to be sent.

## 2022-12-25 ENCOUNTER — Ambulatory Visit
Admission: RE | Admit: 2022-12-25 | Discharge: 2022-12-25 | Disposition: A | Discharge status: Home / Self Care | Payer: Medicare Other | Source: Ambulatory Visit | Attending: Hematology and Oncology | Admitting: Hematology and Oncology

## 2022-12-25 DIAGNOSIS — C50411 Malignant neoplasm of upper-outer quadrant of right female breast: Secondary | ICD-10-CM

## 2022-12-25 MED ORDER — GADOPICLENOL 0.5 MMOL/ML IV SOLN
6.0000 mL | Freq: Once | INTRAVENOUS | Status: AC | PRN
  Administered 2022-12-25: 6 mL via INTRAVENOUS

## 2022-12-28 ENCOUNTER — Other Ambulatory Visit: Payer: Self-pay | Admitting: General Surgery

## 2022-12-28 ENCOUNTER — Telehealth: Payer: Self-pay | Admitting: *Deleted

## 2022-12-28 ENCOUNTER — Encounter: Payer: Self-pay | Admitting: *Deleted

## 2022-12-28 DIAGNOSIS — Z170 Estrogen receptor positive status [ER+]: Secondary | ICD-10-CM

## 2022-12-28 NOTE — Telephone Encounter (Signed)
Spoke to pt concerning [facility] from 12/20/22. Discussed need for MRI bx per Dr. Dwain Sarna as well as ordering and pt to receive a call with an appt to schedule. Encourage pt to call with needs. Received verbal understanding.  Denies further questions or needs at this time regarding dx or treatment care plan

## 2023-01-01 ENCOUNTER — Encounter: Payer: Self-pay | Admitting: *Deleted

## 2023-01-04 ENCOUNTER — Ambulatory Visit
Admission: RE | Admit: 2023-01-04 | Discharge: 2023-01-04 | Disposition: A | Discharge status: Home / Self Care | Payer: Medicare Other | Source: Ambulatory Visit | Attending: General Surgery | Admitting: General Surgery

## 2023-01-04 ENCOUNTER — Other Ambulatory Visit: Payer: Self-pay | Admitting: Body Imaging

## 2023-01-04 DIAGNOSIS — Z170 Estrogen receptor positive status [ER+]: Secondary | ICD-10-CM

## 2023-01-04 MED ORDER — GADOPICLENOL 0.5 MMOL/ML IV SOLN
6.0000 mL | Freq: Once | INTRAVENOUS | Status: AC | PRN
  Administered 2023-01-04: 6 mL via INTRAVENOUS

## 2023-01-05 ENCOUNTER — Encounter: Payer: Self-pay | Admitting: *Deleted

## 2023-01-05 LAB — SURGICAL PATHOLOGY

## 2023-01-12 ENCOUNTER — Other Ambulatory Visit: Payer: Self-pay | Admitting: General Surgery

## 2023-01-12 ENCOUNTER — Encounter: Payer: Self-pay | Admitting: *Deleted

## 2023-01-12 DIAGNOSIS — C50911 Malignant neoplasm of unspecified site of right female breast: Secondary | ICD-10-CM

## 2023-01-31 ENCOUNTER — Other Ambulatory Visit: Payer: Medicare Other

## 2023-04-09 ENCOUNTER — Ambulatory Visit
Admission: RE | Admit: 2023-04-09 | Discharge: 2023-04-09 | Disposition: A | Discharge status: Home / Self Care | Payer: Medicare Other | Source: Ambulatory Visit | Attending: General Surgery | Admitting: General Surgery

## 2023-04-09 DIAGNOSIS — C50911 Malignant neoplasm of unspecified site of right female breast: Secondary | ICD-10-CM

## 2023-04-16 ENCOUNTER — Encounter: Payer: Self-pay | Admitting: *Deleted

## 2023-06-20 ENCOUNTER — Other Ambulatory Visit: Payer: Self-pay | Admitting: General Surgery

## 2023-06-20 DIAGNOSIS — C50911 Malignant neoplasm of unspecified site of right female breast: Secondary | ICD-10-CM

## 2023-06-24 ENCOUNTER — Ambulatory Visit
Admission: RE | Admit: 2023-06-24 | Discharge: 2023-06-24 | Disposition: A | Discharge status: Home / Self Care | Source: Ambulatory Visit | Attending: General Surgery | Admitting: General Surgery

## 2023-06-24 DIAGNOSIS — C50911 Malignant neoplasm of unspecified site of right female breast: Secondary | ICD-10-CM

## 2023-06-24 MED ORDER — GADOPICLENOL 0.5 MMOL/ML IV SOLN
6.0000 mL | Freq: Once | INTRAVENOUS | Status: AC | PRN
Start: 2023-06-24 — End: 2023-06-24
  Administered 2023-06-24: 6 mL via INTRAVENOUS

## 2023-06-27 ENCOUNTER — Encounter: Payer: Self-pay | Admitting: *Deleted

## 2023-07-04 ENCOUNTER — Encounter: Payer: Self-pay | Admitting: *Deleted

## 2023-07-05 ENCOUNTER — Other Ambulatory Visit: Payer: Self-pay | Admitting: General Surgery

## 2023-07-05 DIAGNOSIS — C50411 Malignant neoplasm of upper-outer quadrant of right female breast: Secondary | ICD-10-CM

## 2023-07-06 ENCOUNTER — Other Ambulatory Visit: Payer: Self-pay | Admitting: General Surgery

## 2023-07-06 DIAGNOSIS — Z170 Estrogen receptor positive status [ER+]: Secondary | ICD-10-CM

## 2023-07-09 ENCOUNTER — Encounter: Payer: Self-pay | Admitting: *Deleted

## 2023-07-09 NOTE — Progress Notes (Signed)
 Surgical Instructions   Your procedure is scheduled on Thursday Jul 12, 2023. Report to [facility] Main Entrance "A" at 12:45 P.M. then check in with the Admitting office. Any questions or running late day of surgery: call [phone]  Questions prior to your surgery date: call [phone], Monday-Friday, 8am-4pm. If you experience any cold or flu symptoms such as cough, fever, chills, shortness of breath, etc. between now and your scheduled surgery, please notify us  at the above number.     Remember:  Do not eat after midnight the night before your surgery   You may drink clear liquids until 11:45 the morning of your surgery.   Clear liquids allowed are: Water, Non-Citrus Juices (without pulp), Carbonated Beverages, Clear Tea (no milk, honey, etc.), Black Coffee Only (NO MILK, CREAM OR POWDERED CREAMER of any kind), and Gatorade.  Patient Instructions  The night before surgery:  No food after midnight. ONLY clear liquids after midnight  The day of surgery (if you do NOT have diabetes):  Drink ONE (1) Pre-Surgery Clear Ensure by 11:45 the morning of surgery. Drink in one sitting. Do not sip.  This drink was given to you during your hospital  pre-op appointment visit.  Nothing else to drink after completing the  Pre-Surgery Clear Ensure.         If you have questions, please contact your surgeon's office.   Take these medicines the morning of surgery with A SIP OF WATER  ALPRAZolam (XANAX)  amLODipine (NORVASC)  anastrozole  (ARIMIDEX )   One week prior to surgery, STOP taking any Aspirin (unless otherwise instructed by your surgeon) Aleve, Naproxen, Ibuprofen, Motrin, Advil, Goody's, BC's, all herbal medications, fish oil, and non-prescription vitamins.                     Do NOT Smoke (Tobacco/Vaping) for 24 hours prior to your procedure.  If you use a CPAP at night, you may bring your mask/headgear for your overnight stay.   You will be asked to remove any contacts,  glasses, piercing's, hearing aid's, dentures/partials prior to surgery. Please bring cases for these items if needed.    Patients discharged the day of surgery will not be allowed to drive home, and someone needs to stay with them for 24 hours.  SURGICAL WAITING ROOM VISITATION Patients may have no more than 2 support people in the waiting area - these visitors may rotate.   Pre-op nurse will coordinate an appropriate time for 1 ADULT support person, who may not rotate, to accompany patient in pre-op.  Children under the age of 68 must have an adult with them who is not the patient and must remain in the main waiting area with an adult.  If the patient needs to stay at the hospital during part of their recovery, the visitor guidelines for inpatient rooms apply.  Please refer to the [facility] website for the visitor guidelines for any additional information.   If you received a COVID test during your pre-op visit  it is requested that you wear a mask when out in public, stay away from anyone that may not be feeling well and notify your surgeon if you develop symptoms. If you have been in contact with anyone that has tested positive in the last 10 days please notify you surgeon.      Pre-operative CHG Bathing Instructions   You can play a key role in reducing the risk of infection after surgery. Your skin needs to be as  free of germs as possible. You can reduce the number of germs on your skin by washing with CHG (chlorhexidine gluconate) soap before surgery. CHG is an antiseptic soap that kills germs and continues to kill germs even after washing.   DO NOT use if you have an allergy to chlorhexidine/CHG or antibacterial soaps. If your skin becomes reddened or irritated, stop using the CHG and notify one of our RNs at [phone].              TAKE A SHOWER THE NIGHT BEFORE SURGERY AND THE DAY OF SURGERY    Please keep in mind the following:  DO NOT shave, including legs and underarms,  48 hours prior to surgery.   You may shave your face before/day of surgery.  Place clean sheets on your bed the night before surgery Use a clean washcloth (not used since being washed) for each shower. DO NOT sleep with pet's night before surgery.  CHG Shower Instructions:  Wash your face and private area with normal soap. If you choose to wash your hair, wash first with your normal shampoo.  After you use shampoo/soap, rinse your hair and body thoroughly to remove shampoo/soap residue.  Turn the water OFF and apply half the bottle of CHG soap to a CLEAN washcloth.  Apply CHG soap ONLY FROM YOUR NECK DOWN TO YOUR TOES (washing for 3-5 minutes)  DO NOT use CHG soap on face, private areas, open wounds, or sores.  Pay special attention to the area where your surgery is being performed.  If you are having back surgery, having someone wash your back for you may be helpful. Wait 2 minutes after CHG soap is applied, then you may rinse off the CHG soap.  Pat dry with a clean towel  Put on clean pajamas    Additional instructions for the day of surgery: DO NOT APPLY any lotions, deodorants, cologne, or perfumes.   Do not wear jewelry or makeup Do not wear nail polish, gel polish, artificial nails, or any other type of covering on natural nails (fingers and toes) Do not bring valuables to the hospital. [facility] is not responsible for valuables/personal belongings. Put on clean/comfortable clothes.  Please brush your teeth.  Ask your nurse before applying any prescription medications to the skin.

## 2023-07-09 NOTE — Progress Notes (Signed)
 Surgical Instructions   Your procedure is scheduled on Thursday Jul 12, 2023. Report to [facility] Main Entrance "A" at 12:45 P.M. then check in with the Admitting office. Any questions or running late day of surgery: call [phone]  Questions prior to your surgery date: call [phone], Monday-Friday, 8am-4pm. If you experience any cold or flu symptoms such as cough, fever, chills, shortness of breath, etc. between now and your scheduled surgery, please notify us  at the above number.     Remember:  Do not eat after midnight the night before your surgery   You may drink clear liquids until 11:45 the morning of your surgery.   Clear liquids allowed are: Water, Non-Citrus Juices (without pulp), Carbonated Beverages, Clear Tea (no milk, honey, etc.), Black Coffee Only (NO MILK, CREAM OR POWDERED CREAMER of any kind), and Gatorade.  Patient Instructions  The night before surgery:  No food after midnight. ONLY clear liquids after midnight  The day of surgery (if you do NOT have diabetes):  Drink ONE (1) Pre-Surgery Clear Ensure by 11:45 the morning of surgery. Drink in one sitting. Do not sip.  This drink was given to you during your hospital  pre-op appointment visit.  Nothing else to drink after completing the  Pre-Surgery Clear Ensure.         If you have questions, please contact your surgeon's office.   Take these medicines the morning of surgery with A SIP OF WATER  ALPRAZolam (XANAX)   May take these medicines IF NEEDED:    One week prior to surgery, STOP taking any Aspirin (unless otherwise instructed by your surgeon) Aleve, Naproxen, Ibuprofen, Motrin, Advil, Goody's, BC's, all herbal medications, fish oil, and non-prescription vitamins.                     Do NOT Smoke (Tobacco/Vaping) for 24 hours prior to your procedure.  If you use a CPAP at night, you may bring your mask/headgear for your overnight stay.   You will be asked to remove any contacts, glasses,  piercing's, hearing aid's, dentures/partials prior to surgery. Please bring cases for these items if needed.    Patients discharged the day of surgery will not be allowed to drive home, and someone needs to stay with them for 24 hours.  SURGICAL WAITING ROOM VISITATION Patients may have no more than 2 support people in the waiting area - these visitors may rotate.   Pre-op nurse will coordinate an appropriate time for 1 ADULT support person, who may not rotate, to accompany patient in pre-op.  Children under the age of 50 must have an adult with them who is not the patient and must remain in the main waiting area with an adult.  If the patient needs to stay at the hospital during part of their recovery, the visitor guidelines for inpatient rooms apply.  Please refer to the [facility] website for the visitor guidelines for any additional information.   If you received a COVID test during your pre-op visit  it is requested that you wear a mask when out in public, stay away from anyone that may not be feeling well and notify your surgeon if you develop symptoms. If you have been in contact with anyone that has tested positive in the last 10 days please notify you surgeon.      Pre-operative CHG Bathing Instructions   You can play a key role in reducing the risk of infection after surgery. Your skin needs  to be as free of germs as possible. You can reduce the number of germs on your skin by washing with CHG (chlorhexidine gluconate) soap before surgery. CHG is an antiseptic soap that kills germs and continues to kill germs even after washing.   DO NOT use if you have an allergy to chlorhexidine/CHG or antibacterial soaps. If your skin becomes reddened or irritated, stop using the CHG and notify one of our RNs at [phone].              TAKE A SHOWER THE NIGHT BEFORE SURGERY AND THE DAY OF SURGERY    Please keep in mind the following:  DO NOT shave, including legs and underarms, 48 hours  prior to surgery.   You may shave your face before/day of surgery.  Place clean sheets on your bed the night before surgery Use a clean washcloth (not used since being washed) for each shower. DO NOT sleep with pet's night before surgery.  CHG Shower Instructions:  Wash your face and private area with normal soap. If you choose to wash your hair, wash first with your normal shampoo.  After you use shampoo/soap, rinse your hair and body thoroughly to remove shampoo/soap residue.  Turn the water OFF and apply half the bottle of CHG soap to a CLEAN washcloth.  Apply CHG soap ONLY FROM YOUR NECK DOWN TO YOUR TOES (washing for 3-5 minutes)  DO NOT use CHG soap on face, private areas, open wounds, or sores.  Pay special attention to the area where your surgery is being performed.  If you are having back surgery, having someone wash your back for you may be helpful. Wait 2 minutes after CHG soap is applied, then you may rinse off the CHG soap.  Pat dry with a clean towel  Put on clean pajamas    Additional instructions for the day of surgery: DO NOT APPLY any lotions, deodorants, cologne, or perfumes.   Do not wear jewelry or makeup Do not wear nail polish, gel polish, artificial nails, or any other type of covering on natural nails (fingers and toes) Do not bring valuables to the hospital. [facility] is not responsible for valuables/personal belongings. Put on clean/comfortable clothes.  Please brush your teeth.  Ask your nurse before applying any prescription medications to the skin.

## 2023-07-10 ENCOUNTER — Encounter (HOSPITAL_COMMUNITY): Payer: Self-pay

## 2023-07-10 ENCOUNTER — Ambulatory Visit
Admission: RE | Admit: 2023-07-10 | Discharge: 2023-07-10 | Disposition: A | Discharge status: Home / Self Care | Source: Ambulatory Visit | Attending: General Surgery | Admitting: General Surgery

## 2023-07-10 ENCOUNTER — Encounter (HOSPITAL_COMMUNITY)
Admission: RE | Admit: 2023-07-10 | Discharge: 2023-07-10 | Disposition: A | Discharge status: Home / Self Care | Source: Ambulatory Visit | Attending: General Surgery | Admitting: General Surgery

## 2023-07-10 ENCOUNTER — Telehealth: Payer: Self-pay | Admitting: Hematology and Oncology

## 2023-07-10 ENCOUNTER — Other Ambulatory Visit: Payer: Self-pay | Admitting: General Surgery

## 2023-07-10 ENCOUNTER — Other Ambulatory Visit: Payer: Self-pay

## 2023-07-10 VITALS — BP 124/75 | HR 87 | Temp 98.1°F | Resp 16 | Ht 62.0 in | Wt 129.4 lb

## 2023-07-10 DIAGNOSIS — Z01818 Encounter for other preprocedural examination: Secondary | ICD-10-CM | POA: Diagnosis present

## 2023-07-10 DIAGNOSIS — Z170 Estrogen receptor positive status [ER+]: Secondary | ICD-10-CM

## 2023-07-10 HISTORY — DX: Nausea with vomiting, unspecified: R11.2

## 2023-07-10 HISTORY — PX: BREAST BIOPSY: SHX20

## 2023-07-10 HISTORY — DX: Other specified postprocedural states: Z98.890

## 2023-07-10 HISTORY — DX: Gastro-esophageal reflux disease without esophagitis: K21.9

## 2023-07-10 LAB — CBC
HCT: 43.7 % (ref 36.0–46.0)
Hemoglobin: 14.5 g/dL (ref 12.0–15.0)
MCH: 31.1 pg (ref 26.0–34.0)
MCHC: 33.2 g/dL (ref 30.0–36.0)
MCV: 93.8 fL (ref 80.0–100.0)
Platelets: 207 10*3/uL (ref 150–400)
RBC: 4.66 MIL/uL (ref 3.87–5.11)
RDW: 12.7 % (ref 11.5–15.5)
WBC: 7.9 10*3/uL (ref 4.0–10.5)
nRBC: 0 % (ref 0.0–0.2)

## 2023-07-10 LAB — BASIC METABOLIC PANEL WITH GFR
Anion gap: 7 (ref 5–15)
BUN: 9 mg/dL (ref 8–23)
CO2: 29 mmol/L (ref 22–32)
Calcium: 9.2 mg/dL (ref 8.9–10.3)
Chloride: 104 mmol/L (ref 98–111)
Creatinine, Ser: 0.8 mg/dL (ref 0.44–1.00)
GFR, Estimated: >60 mL/min (ref >60)
Glucose, Bld: 94 mg/dL (ref 70–99)
Potassium: 4.1 mmol/L (ref 3.5–5.1)
Sodium: 140 mmol/L (ref 135–145)

## 2023-07-10 NOTE — Telephone Encounter (Signed)
 Left vm with daughter about scheduled appt date and time.

## 2023-07-10 NOTE — Progress Notes (Signed)
 Surgical Instructions   Your procedure is scheduled on Thursday Jul 12, 2023. Report to [facility] Main Entrance "A" at 12:45 P.M. then check in with the Admitting office. Any questions or running late day of surgery: call [phone]  Questions prior to your surgery date: call [phone], Monday-Friday, 8am-4pm. If you experience any cold or flu symptoms such as cough, fever, chills, shortness of breath, etc. between now and your scheduled surgery, please notify us  at the above number.     Remember:  Do not eat after midnight the night before your surgery   You may drink clear liquids until 11:45 the morning of your surgery.   Clear liquids allowed are: Water, Non-Citrus Juices (without pulp), Carbonated Beverages, Clear Tea (no milk, honey, etc.), Black Coffee Only (NO MILK, CREAM OR POWDERED CREAMER of any kind), and Gatorade.  Patient Instructions  The night before surgery:  No food after midnight. ONLY clear liquids after midnight  The day of surgery (if you do NOT have diabetes):  Drink ONE (1) Pre-Surgery Clear Ensure by 11:45 the morning of surgery. Drink in one sitting. Do not sip.  This drink was given to you during your hospital  pre-op appointment visit.  Nothing else to drink after completing the  Pre-Surgery Clear Ensure.         If you have questions, please contact your surgeon's office.   Take these medicines the morning of surgery with A SIP OF WATER  ALPRAZolam (XANAX)  amLODipine (NORVASC)  omeprazole (PRILOSEC)  PARoxetine (PAXIL)   May take these medicines IF NEEDED: Acetaminophen (TYLENOL PO)  albuterol (PROAIR HFA) 108 (90 Base) MCG/ACT inhaler -bring to the hospital cetirizine (ZYRTEC)  meclizine  (ANTIVERT    One week prior to surgery, STOP taking any Aspirin (unless otherwise instructed by your surgeon) Aleve, Naproxen, Ibuprofen, Motrin, Advil, Goody's, BC's, all herbal medications, fish oil, and non-prescription vitamins.                      Do NOT Smoke (Tobacco/Vaping) for 24 hours prior to your procedure.  If you use a CPAP at night, you may bring your mask/headgear for your overnight stay.   You will be asked to remove any contacts, glasses, piercing's, hearing aid's, dentures/partials prior to surgery. Please bring cases for these items if needed.    Patients discharged the day of surgery will not be allowed to drive home, and someone needs to stay with them for 24 hours.  SURGICAL WAITING ROOM VISITATION Patients may have no more than 2 support people in the waiting area - these visitors may rotate.   Pre-op nurse will coordinate an appropriate time for 1 ADULT support person, who may not rotate, to accompany patient in pre-op.  Children under the age of 15 must have an adult with them who is not the patient and must remain in the main waiting area with an adult.  If the patient needs to stay at the hospital during part of their recovery, the visitor guidelines for inpatient rooms apply.  Please refer to the [facility] website for the visitor guidelines for any additional information.   If you received a COVID test during your pre-op visit  it is requested that you wear a mask when out in public, stay away from anyone that may not be feeling well and notify your surgeon if you develop symptoms. If you have been in contact with anyone that has tested positive in the last 10 days please notify you  Careers adviser.      Pre-operative CHG Bathing Instructions   You can play a key role in reducing the risk of infection after surgery. Your skin needs to be as free of germs as possible. You can reduce the number of germs on your skin by washing with CHG (chlorhexidine gluconate) soap before surgery. CHG is an antiseptic soap that kills germs and continues to kill germs even after washing.   DO NOT use if you have an allergy to chlorhexidine/CHG or antibacterial soaps. If your skin becomes reddened or irritated, stop using the CHG  and notify one of our RNs at [phone].              TAKE A SHOWER THE NIGHT BEFORE SURGERY AND THE DAY OF SURGERY    Please keep in mind the following:  DO NOT shave, including legs and underarms, 48 hours prior to surgery.   You may shave your face before/day of surgery.  Place clean sheets on your bed the night before surgery Use a clean washcloth (not used since being washed) for each shower. DO NOT sleep with pet's night before surgery.  CHG Shower Instructions:  Wash your face and private area with normal soap. If you choose to wash your hair, wash first with your normal shampoo.  After you use shampoo/soap, rinse your hair and body thoroughly to remove shampoo/soap residue.  Turn the water OFF and apply half the bottle of CHG soap to a CLEAN washcloth.  Apply CHG soap ONLY FROM YOUR NECK DOWN TO YOUR TOES (washing for 3-5 minutes)  DO NOT use CHG soap on face, private areas, open wounds, or sores.  Pay special attention to the area where your surgery is being performed.  If you are having back surgery, having someone wash your back for you may be helpful. Wait 2 minutes after CHG soap is applied, then you may rinse off the CHG soap.  Pat dry with a clean towel  Put on clean pajamas    Additional instructions for the day of surgery: DO NOT APPLY any lotions, deodorants, cologne, or perfumes.   Do not wear jewelry or makeup Do not wear nail polish, gel polish, artificial nails, or any other type of covering on natural nails (fingers and toes) Do not bring valuables to the hospital. [facility] is not responsible for valuables/personal belongings. Put on clean/comfortable clothes.  Please brush your teeth.  Ask your nurse before applying any prescription medications to the skin.

## 2023-07-10 NOTE — Progress Notes (Signed)
 PCP - Melissa Brown-Patram,NP Cardiologist - denies  PPM/ICD - denies Device Orders -  Rep Notified -   Chest x-ray - na EKG - 07/10/23 Stress Test - denies ECHO - 04/04/17 Cardiac Cath - denies  Sleep Study - denies CPAP -   Fasting Blood Sugar - na Checks Blood Sugar _____ times a day  Last dose of GLP1 agonist-  na GLP1 instructions:   Blood Thinner Instructions:na Aspirin Instructions:na  ERAS Protcol -clear liquids until 1145 PRE-SURGERY Ensure or G2- Ensure  COVID TEST- na   Anesthesia review: yes-seed patient  Patient denies shortness of breath, fever, cough and chest pain at PAT appointment   All instructions explained to the patient, with a verbal understanding of the material. Patient agrees to go over the instructions while at home for a better understanding. . The opportunity to ask questions was provided.

## 2023-07-12 ENCOUNTER — Ambulatory Visit
Admission: RE | Admit: 2023-07-12 | Discharge: 2023-07-12 | Disposition: A | Discharge status: Home / Self Care | Source: Ambulatory Visit | Attending: General Surgery | Admitting: General Surgery

## 2023-07-12 ENCOUNTER — Encounter (HOSPITAL_COMMUNITY): Payer: Self-pay | Admitting: General Surgery

## 2023-07-12 ENCOUNTER — Ambulatory Visit (HOSPITAL_COMMUNITY): Admitting: Physician Assistant

## 2023-07-12 ENCOUNTER — Encounter (HOSPITAL_COMMUNITY)
Admission: RE | Disposition: A | Discharge status: Home / Self Care | Payer: Self-pay | Source: Home / Self Care | Attending: General Surgery

## 2023-07-12 ENCOUNTER — Ambulatory Visit (HOSPITAL_COMMUNITY)
Admission: RE | Admit: 2023-07-12 | Discharge: 2023-07-12 | Disposition: A | Discharge status: Home / Self Care | Attending: General Surgery | Admitting: General Surgery

## 2023-07-12 ENCOUNTER — Ambulatory Visit (HOSPITAL_COMMUNITY): Admitting: Anesthesiology

## 2023-07-12 ENCOUNTER — Other Ambulatory Visit: Payer: Self-pay

## 2023-07-12 DIAGNOSIS — K219 Gastro-esophageal reflux disease without esophagitis: Secondary | ICD-10-CM | POA: Insufficient documentation

## 2023-07-12 DIAGNOSIS — I10 Essential (primary) hypertension: Secondary | ICD-10-CM | POA: Insufficient documentation

## 2023-07-12 DIAGNOSIS — C50411 Malignant neoplasm of upper-outer quadrant of right female breast: Secondary | ICD-10-CM | POA: Insufficient documentation

## 2023-07-12 DIAGNOSIS — F17210 Nicotine dependence, cigarettes, uncomplicated: Secondary | ICD-10-CM | POA: Insufficient documentation

## 2023-07-12 DIAGNOSIS — Z7951 Long term (current) use of inhaled steroids: Secondary | ICD-10-CM | POA: Insufficient documentation

## 2023-07-12 DIAGNOSIS — J449 Chronic obstructive pulmonary disease, unspecified: Secondary | ICD-10-CM | POA: Insufficient documentation

## 2023-07-12 DIAGNOSIS — Z170 Estrogen receptor positive status [ER+]: Secondary | ICD-10-CM | POA: Diagnosis present

## 2023-07-12 DIAGNOSIS — Z1721 Progesterone receptor positive status: Secondary | ICD-10-CM | POA: Insufficient documentation

## 2023-07-12 DIAGNOSIS — Z1732 Human epidermal growth factor receptor 2 negative status: Secondary | ICD-10-CM | POA: Diagnosis not present

## 2023-07-12 HISTORY — PX: AXILLARY SENTINEL NODE BIOPSY: SHX5738

## 2023-07-12 HISTORY — PX: BREAST LUMPECTOMY WITH RADIOACTIVE SEED LOCALIZATION: SHX6424

## 2023-07-12 SURGERY — BREAST LUMPECTOMY WITH RADIOACTIVE SEED LOCALIZATION
Anesthesia: General | Site: Breast | Laterality: Right

## 2023-07-12 MED ORDER — TRAMADOL HCL 50 MG PO TABS: 100.0000 mg | ORAL_TABLET | Freq: Four times a day (QID) | ORAL | 0 refills | Status: DC | PRN

## 2023-07-12 MED ORDER — ENSURE PRE-SURGERY PO LIQD: 296.0000 mL | Freq: Once | ORAL | Status: DC

## 2023-07-12 MED ORDER — BUPIVACAINE LIPOSOME 1.3 % IJ SUSP
INTRAMUSCULAR | Status: DC | PRN
  Administered 2023-07-12: 10 mL via PERINEURAL

## 2023-07-12 MED ORDER — FENTANYL CITRATE (PF) 100 MCG/2ML IJ SOLN
INTRAMUSCULAR | Status: AC
Start: 2023-07-12 — End: 2023-07-12
  Administered 2023-07-12: 25 ug via INTRAVENOUS
  Filled 2023-07-12: qty 2

## 2023-07-12 MED ORDER — ACETAMINOPHEN 500 MG PO TABS
1000.0000 mg | ORAL_TABLET | ORAL | Status: AC
  Administered 2023-07-12: 1000 mg via ORAL
  Filled 2023-07-12: qty 2

## 2023-07-12 MED ORDER — FENTANYL CITRATE (PF) 100 MCG/2ML IJ SOLN: 25.0000 ug | INTRAMUSCULAR | Status: DC | PRN

## 2023-07-12 MED ORDER — FENTANYL CITRATE (PF) 100 MCG/2ML IJ SOLN
INTRAMUSCULAR | Status: AC
  Administered 2023-07-12: 100 ug via INTRAVENOUS
  Filled 2023-07-12: qty 2

## 2023-07-12 MED ORDER — LIDOCAINE 2% (20 MG/ML) 5 ML SYRINGE
INTRAMUSCULAR | Status: DC | PRN
  Administered 2023-07-12: 20 mg via INTRAVENOUS

## 2023-07-12 MED ORDER — ORAL CARE MOUTH RINSE: 15.0000 mL | Freq: Once | OROMUCOSAL | Status: AC

## 2023-07-12 MED ORDER — PHENYLEPHRINE 80 MCG/ML (10ML) SYRINGE FOR IV PUSH (FOR BLOOD PRESSURE SUPPORT)
PREFILLED_SYRINGE | INTRAVENOUS | Status: DC | PRN
  Administered 2023-07-12 (×2): 120 ug via INTRAVENOUS
  Administered 2023-07-12: 160 ug via INTRAVENOUS

## 2023-07-12 MED ORDER — EPHEDRINE SULFATE-NACL 50-0.9 MG/10ML-% IV SOSY
PREFILLED_SYRINGE | INTRAVENOUS | Status: DC | PRN
  Administered 2023-07-12: 10 mg via INTRAVENOUS

## 2023-07-12 MED ORDER — 0.9 % SODIUM CHLORIDE (POUR BTL) OPTIME
TOPICAL | Status: DC | PRN
  Administered 2023-07-12: 1000 mL

## 2023-07-12 MED ORDER — CHLORHEXIDINE GLUCONATE CLOTH 2 % EX PADS: 6.0000 | MEDICATED_PAD | Freq: Once | CUTANEOUS | Status: DC

## 2023-07-12 MED ORDER — CEFAZOLIN SODIUM-DEXTROSE 2-4 GM/100ML-% IV SOLN
2.0000 g | INTRAVENOUS | Status: AC
  Administered 2023-07-12: 2 g via INTRAVENOUS
  Filled 2023-07-12: qty 100

## 2023-07-12 MED ORDER — BUPIVACAINE-EPINEPHRINE (PF) 0.25% -1:200000 IJ SOLN
INTRAMUSCULAR | Status: AC
  Filled 2023-07-12: qty 30

## 2023-07-12 MED ORDER — BUPIVACAINE LIPOSOME 1.3 % IJ SUSP
INTRAMUSCULAR | Status: AC
  Filled 2023-07-12: qty 10

## 2023-07-12 MED ORDER — DROPERIDOL 2.5 MG/ML IJ SOLN: 0.6250 mg | Freq: Once | INTRAMUSCULAR | Status: DC | PRN

## 2023-07-12 MED ORDER — OXYCODONE HCL 5 MG PO TABS
ORAL_TABLET | ORAL | Status: AC
  Administered 2023-07-12: 5 mg via ORAL
  Filled 2023-07-12: qty 1

## 2023-07-12 MED ORDER — MAGTRACE LYMPHATIC TRACER
INTRAMUSCULAR | Status: DC | PRN
  Administered 2023-07-12: 2 mL via INTRAMUSCULAR

## 2023-07-12 MED ORDER — BUPIVACAINE-EPINEPHRINE (PF) 0.5% -1:200000 IJ SOLN
INTRAMUSCULAR | Status: DC | PRN
  Administered 2023-07-12: 20 mL via PERINEURAL

## 2023-07-12 MED ORDER — HEMOSTATIC AGENTS (NO CHARGE) OPTIME
TOPICAL | Status: DC | PRN
  Administered 2023-07-12: 1

## 2023-07-12 MED ORDER — DEXAMETHASONE SODIUM PHOSPHATE 10 MG/ML IJ SOLN
INTRAMUSCULAR | Status: DC | PRN
  Administered 2023-07-12: 10 mg via INTRAVENOUS

## 2023-07-12 MED ORDER — ONDANSETRON HCL 4 MG/2ML IJ SOLN
INTRAMUSCULAR | Status: DC | PRN
  Administered 2023-07-12: 4 mg via INTRAVENOUS

## 2023-07-12 MED ORDER — OXYCODONE HCL 5 MG PO TABS: 5.0000 mg | ORAL_TABLET | Freq: Once | ORAL | Status: AC | PRN

## 2023-07-12 MED ORDER — PROPOFOL 10 MG/ML IV BOLUS
INTRAVENOUS | Status: DC | PRN
  Administered 2023-07-12: 140 mg via INTRAVENOUS
  Administered 2023-07-12: 100 ug/kg/min via INTRAVENOUS

## 2023-07-12 MED ORDER — MIDAZOLAM HCL 2 MG/2ML IJ SOLN
INTRAMUSCULAR | Status: AC
  Filled 2023-07-12: qty 2

## 2023-07-12 MED ORDER — LACTATED RINGERS IV SOLN: INTRAVENOUS | Status: DC

## 2023-07-12 MED ORDER — CHLORHEXIDINE GLUCONATE 0.12 % MT SOLN
15.0000 mL | Freq: Once | OROMUCOSAL | Status: AC
  Administered 2023-07-12: 15 mL via OROMUCOSAL
  Filled 2023-07-12: qty 15

## 2023-07-12 MED ORDER — FENTANYL CITRATE (PF) 250 MCG/5ML IJ SOLN
INTRAMUSCULAR | Status: AC
  Filled 2023-07-12: qty 5

## 2023-07-12 MED ORDER — OXYCODONE HCL 5 MG/5ML PO SOLN: 5.0000 mg | Freq: Once | ORAL | Status: AC | PRN

## 2023-07-12 MED ORDER — FENTANYL CITRATE (PF) 100 MCG/2ML IJ SOLN
INTRAMUSCULAR | Status: DC | PRN
Start: 2023-07-12 — End: 2023-07-12
  Administered 2023-07-12: 50 ug via INTRAVENOUS

## 2023-07-12 MED ORDER — MIDAZOLAM HCL 5 MG/5ML IJ SOLN
INTRAMUSCULAR | Status: DC | PRN
  Administered 2023-07-12: 1 mg via INTRAVENOUS

## 2023-07-12 MED ORDER — FENTANYL CITRATE (PF) 100 MCG/2ML IJ SOLN: 100.0000 ug | Freq: Once | INTRAMUSCULAR | Status: AC

## 2023-07-12 SURGICAL SUPPLY — 48 items
BAG COUNTER SPONGE SURGICOUNT (BAG) ×2 IMPLANT
BENZOIN TINCTURE PRP APPL 2/3 (GAUZE/BANDAGES/DRESSINGS) IMPLANT
BINDER BREAST LRG (GAUZE/BANDAGES/DRESSINGS) IMPLANT
BINDER BREAST XLRG (GAUZE/BANDAGES/DRESSINGS) IMPLANT
CANISTER SUCT 3000ML PPV (MISCELLANEOUS) ×2 IMPLANT
CHLORAPREP W/TINT 26 (MISCELLANEOUS) ×2 IMPLANT
CLIP APPLIE 9.375 MED OPEN (MISCELLANEOUS) IMPLANT
CLIP TI MEDIUM 6 (CLIP) IMPLANT
CNTNR URN SCR LID CUP LEK RST (MISCELLANEOUS) ×2 IMPLANT
COVER PROBE W GEL 5X96 (DRAPES) ×2 IMPLANT
COVER SURGICAL LIGHT HANDLE (MISCELLANEOUS) ×2 IMPLANT
DERMABOND ADVANCED .7 DNX12 (GAUZE/BANDAGES/DRESSINGS) ×2 IMPLANT
DEVICE DUBIN SPECIMEN MAMMOGRA (MISCELLANEOUS) ×2 IMPLANT
DRAPE CHEST BREAST 15X10 FENES (DRAPES) ×2 IMPLANT
ELECT COATED BLADE 2.86 ST (ELECTRODE) ×2 IMPLANT
ELECTRODE REM PT RTRN 9FT ADLT (ELECTROSURGICAL) ×2 IMPLANT
GAUZE 4X4 16PLY ~~LOC~~+RFID DBL (SPONGE) ×2 IMPLANT
GAUZE SPONGE 4X4 12PLY STRL (GAUZE/BANDAGES/DRESSINGS) IMPLANT
GLOVE BIO SURGEON STRL SZ7 (GLOVE) ×4 IMPLANT
GLOVE BIOGEL PI IND STRL 7.5 (GLOVE) ×2 IMPLANT
GOWN STRL REUS W/ TWL LRG LVL3 (GOWN DISPOSABLE) ×4 IMPLANT
HEMOSTAT ARISTA ABSORB 3G PWDR (HEMOSTASIS) IMPLANT
KIT BASIN OR (CUSTOM PROCEDURE TRAY) ×2 IMPLANT
KIT MARKER MARGIN INK (KITS) ×2 IMPLANT
KIT TURNOVER KIT B (KITS) ×2 IMPLANT
LIGHT WAVEGUIDE WIDE FLAT (MISCELLANEOUS) IMPLANT
NDL 18GX1X1/2 (RX/OR ONLY) (NEEDLE) IMPLANT
NDL FILTER BLUNT 18X1 1/2 (NEEDLE) IMPLANT
NDL HYPO 25GX1X1/2 BEV (NEEDLE) ×2 IMPLANT
NEEDLE 18GX1X1/2 (RX/OR ONLY) (NEEDLE) IMPLANT
NEEDLE FILTER BLUNT 18X1 1/2 (NEEDLE) ×2 IMPLANT
NEEDLE HYPO 25GX1X1/2 BEV (NEEDLE) ×2 IMPLANT
NS IRRIG 1000ML POUR BTL (IV SOLUTION) ×2 IMPLANT
PACK GENERAL/GYN (CUSTOM PROCEDURE TRAY) ×2 IMPLANT
PAD ARMBOARD POSITIONER FOAM (MISCELLANEOUS) ×2 IMPLANT
PENCIL SMOKE EVACUATOR (MISCELLANEOUS) ×2 IMPLANT
SPIKE FLUID TRANSFER (MISCELLANEOUS) ×2 IMPLANT
STRIP CLOSURE SKIN 1/2X4 (GAUZE/BANDAGES/DRESSINGS) ×2 IMPLANT
SUT MNCRL AB 4-0 PS2 18 (SUTURE) ×2 IMPLANT
SUT MON AB 5-0 PS2 18 (SUTURE) IMPLANT
SUT SILK 2 0 SH (SUTURE) IMPLANT
SUT VIC AB 2-0 SH 27X BRD (SUTURE) IMPLANT
SUT VIC AB 2-0 SH 27XBRD (SUTURE) ×2 IMPLANT
SUT VIC AB 3-0 SH 27X BRD (SUTURE) ×2 IMPLANT
SUT VIC AB 3-0 SH 27XBRD (SUTURE) ×2 IMPLANT
SYR CONTROL 10ML LL (SYRINGE) ×2 IMPLANT
TOWEL GREEN STERILE (TOWEL DISPOSABLE) ×2 IMPLANT
TOWEL GREEN STERILE FF (TOWEL DISPOSABLE) ×2 IMPLANT

## 2023-07-12 NOTE — Transfer of Care (Signed)
 Immediate Anesthesia Transfer of Care Note  Patient: Amy Lucas  Procedure(s) Performed: RIGHT BREAST LUMPECTOMY WITH RADIOACTIVE SEED LOCALIZATION (Right: Breast) RIGHT AXILLARY SENTINEL LYMPH NODE BIOPSY (Right)  Patient Location: PACU  Anesthesia Type:General  Level of Consciousness: awake and sedated  Airway & Oxygen Therapy: Patient Spontanous Breathing and Patient connected to nasal cannula oxygen  Post-op Assessment: Report given to RN and Post -op Vital signs reviewed and stable  Post vital signs: Reviewed and stable  Last Vitals:  Vitals Value Taken Time  BP 110/55 07/12/23 1717  Temp    Pulse 78 07/12/23 1719  Resp 22 07/12/23 1719  SpO2 93 % 07/12/23 1719  Vitals shown include unfiled device data.  Last Pain:  Vitals:   07/12/23 1334  TempSrc:   PainSc: 0-No pain         Complications: No notable events documented.

## 2023-07-12 NOTE — H&P (Signed)
 68 yof who has no prior history and underwent screening mm that shows a left sided breast mass/asymmetry. There are two small masses on mm. Had a biopsy of one initially that was benign but this was not mammographically seen mass. She has two biopsies that are ILC. There is a 1.1 cm mass noted and lateral breast mass as well. Ax us  is negative. The 2 clips are 3 cm apart. Both biopsies are grade II ILC that is 100% er and pr pos, her 2 negative and Ki is 3%.  She had a breast MRI that showed the 2 known cancers. I discussed this with radiology and was concerned that this area might be bigger. She had another biopsy and this is also an invasive lobular carcinoma. The cancer also by imaging appear to be at least 6 cm in the long as a lot of the central portion of her breast.  She has been on antiestrogens for about six months now and has recent MRI that shows interval decreased in all masses.   Review of Systems: A complete review of systems was obtained from the patient. I have reviewed this information and discussed as appropriate with the patient. See HPI as well for other ROS.  Review of Systems  All other systems reviewed and are negative.  Medical History: Past Medical History:  Diagnosis Date  Anxiety  COPD (chronic obstructive pulmonary disease) (CMS/HHS-HCC)  GERD (gastroesophageal reflux disease)  History of cancer  Hyperlipidemia   Patient Active Problem List  Diagnosis  Anxiety  Breast mass, right  Chronic nonseasonal allergic rhinitis due to pollen  COPD (chronic obstructive pulmonary disease) (CMS/HHS-HCC)  Gastro-esophageal reflux disease without esophagitis  Malignant neoplasm of upper-outer quadrant of right breast in female, estrogen receptor positive (CMS/HHS-HCC)   Past Surgical History:  Procedure Laterality Date  Right Breast Biopsy Right 12/12/2022  HEMORRHOID SURGERY N/A   Allergies  Allergen Reactions  Sulfa (Sulfonamide Antibiotics) Nausea And Vomiting   Other reaction(s): GI intolerance  Triamcinolone Acetonide Other (See Comments)  Seizures   Current Outpatient Medications on File Prior to Visit  Medication Sig Dispense Refill  ALPRAZolam (XANAX) 1 MG tablet Take 0.5-1 tablets by mouth 4 (four) times daily as needed for Anxiety  amLODIPine (NORVASC) 2.5 MG tablet Take 2.5 mg by mouth once daily  cetirizine (ZYRTEC) 10 MG tablet Take 1 tablet by mouth once daily  fluticasone propion-salmeteroL (ADVAIR DISKUS) 250-50 mcg/dose diskus inhaler Inhale 1 Puff into the lungs 2 (two) times daily as needed (as needed)  fluticasone propionate (FLONASE) 50 mcg/actuation nasal spray Place 1-2 sprays into both nostrils once daily  omeprazole (PRILOSEC) 40 MG DR capsule Take 40 mg by mouth once daily  PARoxetine (PAXIL) 10 MG tablet Take 10 mg by mouth every morning  simvastatin (ZOCOR) 20 MG tablet Take 20 mg by mouth at bedtime    Family History  Problem Relation Age of Onset  Skin cancer Mother    Social History   Tobacco Use  Smoking Status Every Day  Types: Cigarettes  Smokeless Tobacco Never  Tobacco Comments  1 pack of cigarettes daily  Marital status: Married  Tobacco Use  Smoking status: Every Day  Types: Cigarettes  Smokeless tobacco: Never  Tobacco comments:  1 pack of cigarettes daily  Vaping Use  Vaping status: Never Used  Substance and Sexual Activity  Alcohol use: Not Currently  Drug use: Never   Objective:   Vitals:  07/03/23 1529   Physical Exam Vitals reviewed.  Chest:  Breasts: Right: No inverted nipple, mass or nipple discharge.  Left: No inverted nipple, mass or nipple discharge.  Lymphadenopathy:  Upper Body:  Right upper body: No supraclavicular or axillary adenopathy.  Left upper body: No supraclavicular or axillary adenopathy.    Assessment and Plan:   left breast seed guided lumpectomy, left ax sn biopsy  Will discuss with radiology but I think that would be reasonable to proceed with  lumpectomy/ sn biopsy. Discussed pos margin risk and even possibility of needing mastectomy.

## 2023-07-12 NOTE — Op Note (Signed)
 Preoperative diagnosis: Clinical stage II right breast cancer status post primary endocrine therapy Postoperative diagnosis: Same as above Procedure: 1.  Right breast radioactive seed bracketed lumpectomy 2.  Injection of mag trace for sentinel lymph node identification 3.  Right deep axillary sentinel lymph node biopsy Surgeon: Dr. Donavan Fuchs Anesthesia: General With a pectoral block Estimated blood loss: Less than 50 cc Complications: None Drains: None Specimens: 1.  Right breast seed bracketed lumpectomy containing 3 clips and 3 seeds 2.  Additional posterior margin marked short superior, long lateral, double deep 3.  Right deep axillary sentinel lymph nodes Sponge needle count was correct at completion Disposition recovery in stable condition  Indication: This is a 78 year old female who had a couple of small masses in her left breast on a screening mammogram.  She had 2 biopsies that were invasive lobular carcinoma.  Axillary ultrasound was negative.  These were ER/PR positive and HER2 negative.  She had a breast MR that look like this might be about 6 cm including most of the central portion of her breast.  She was attempting to avoid a mastectomy and was started on antiestrogens for about 6 months with significant improvement in her MRI.  We discussed a bracketed lumpectomy with the understanding if there was still a large volume of disease she might need a mastectomy.  Procedure: After informed consent was obtained she first had the seeds placed at the breast center.  I had these mammograms available for my review and I had discussed this with the radiologist prior to doing the case.  She was given antibiotics.  SCDs were in place.  She was placed under general anesthesia without complication.  She was prepped and draped in the standard sterile surgical fashion.  A surgical timeout was then performed.  I injected a cc of mag trace in the subareolar position as well as a cc in the  upper outer quadrant.  I then massaged this for 5 minutes.  I first did the lumpectomy.  I made a periareolar incision in order to hide the scar later.  I used the neoprobe to then remove the 3 seeds and some of the surrounding tissue.  Mammogram confirmed removal of 3 seeds and 3 clips.  I did take some additional posterior margin so that the posterior margin is now the muscle.  This was marked as above.  I obtained hemostasis.  I closed down the cavity with 2-0 Vicryl suture to recreate the breast.  I then closed the skin with 3-0 Vicryl and 5-0 Monocryl.  Eventually glue and Steri-Strips were applied.  I then made an incision in the low axilla.  I carried this to the axillary fascia.  There was faint activity but I was able to find what appeared to be several sentinel lymph nodes.  A couple of these appeared brown as well.  I remove this packet of lymph nodes there were no other palpable brown or nodes that had activity present.  Hemostasis was observed.  I then closed this with 2-0 Vicryl, 3-0 Vicryl and 4-0 Monocryl.  Glue and Steri-Strips were applied.  She tolerated this well was extubated and transferred to recovery stable.

## 2023-07-12 NOTE — Interval H&P Note (Signed)
 History and Physical Interval Note:  07/12/2023 2:44 PM  Amy Lucas  has presented today for surgery, with the diagnosis of RIGHT BREAST CANCER.  The various methods of treatment have been discussed with the patient and family. After consideration of risks, benefits and other options for treatment, the patient has consented to  Procedure(s) with comments: BREAST LUMPECTOMY WITH RADIOACTIVE SEED LOCALIZATION (Right) - RIGHT BREAST SEED GUIDED LUMPECTOMIES RIGHT AXILLARY SENTINEL NODE BIOPSY MAGTRACE INJECTION BIOPSY, LYMPH NODE, SENTINEL, AXILLARY (Right) as a surgical intervention.  The patient's history has been reviewed, patient examined, no change in status, stable for surgery.  I have reviewed the patient's chart and labs.  Questions were answered to the patient's satisfaction.     Amy Lucas

## 2023-07-12 NOTE — Discharge Instructions (Signed)
 [facility] Office Phone Number [phone]  POST OP INSTRUCTIONS Take 400 mg of ibuprofen every 8 hours or 650 mg tylenol every 6 hours for next 72 hours then as needed. Use ice several times daily also.  A prescription for pain medication may be given to you upon discharge.  Take your pain medication as prescribed, if needed.  If narcotic pain medicine is not needed, then you may take acetaminophen (Tylenol), naprosyn (Alleve) or ibuprofen (Advil) as needed. Take your usually prescribed medications unless otherwise directed If you need a refill on your pain medication, please contact your pharmacy.  They will contact our office to request authorization.  Prescriptions will not be filled after 5pm or on week-ends. You should eat very light the first 24 hours after surgery, such as soup, crackers, pudding, etc.  Resume your normal diet the day after surgery. Most patients will experience some swelling and bruising in the breast.  Ice packs and a good support bra will help.  Wear the breast binder provided or a sports bra for 72 hours day and night.  After that wear a sports bra during the day until you return to the office. Swelling and bruising can take several days to resolve.  It is common to experience some constipation if taking pain medication after surgery.  Increasing fluid intake and taking a stool softener will usually help or prevent this problem from occurring.  A mild laxative (Milk of Magnesia or Miralax) should be taken according to package directions if there are no bowel movements after 48 hours. I used skin glue on the incision, you may shower in 24 hours.  The glue will flake off over the next 2-3 weeks.  Any sutures or staples will be removed at the office during your follow-up visit. ACTIVITIES:  You may resume regular daily activities (gradually increasing) beginning the next day.  Wearing a good support bra or sports bra minimizes pain and swelling.  You may have  sexual intercourse when it is comfortable. You may drive when you no longer are taking prescription pain medication, you can comfortably wear a seatbelt, and you can safely maneuver your car and apply brakes. RETURN TO WORK:  ______________________________________________________________________________________ Amy Lucas should see your doctor in the office for a follow-up appointment approximately two weeks after your surgery.  Your doctor's nurse will typically make your follow-up appointment when she calls you with your pathology report.  Expect your pathology report 3-4 business days after your surgery.  You may call to check if you do not hear from [place] after three days. OTHER INSTRUCTIONS: _______________________________________________________________________________________________ _____________________________________________________________________________________________________________________________________ _____________________________________________________________________________________________________________________________________ _____________________________________________________________________________________________________________________________________  WHEN TO CALL DR Amy Lucas: Fever over 101.0 Nausea and/or vomiting. Extreme swelling or bruising. Continued bleeding from incision. Increased pain, redness, or drainage from the incision.  The clinic staff is available to answer your questions during regular business hours.  Please don't hesitate to call and ask to speak to one of the nurses for clinical concerns.  If you have a medical emergency, go to the nearest emergency room or call 911.  A surgeon from [facility] is always on call at the hospital.  For further questions, please visit [email or web address]

## 2023-07-12 NOTE — Anesthesia Procedure Notes (Signed)
 Anesthesia Regional Block: Pectoralis block   Pre-Anesthetic Checklist: , timeout performed,  Correct Patient, Correct Site, Correct Laterality,  Correct Procedure, Correct Position, site marked,  Risks and benefits discussed,  Pre-op evaluation,  At surgeon's request and post-op pain management  Laterality: Right  Prep: Maximum Sterile Barrier Precautions used, chloraprep       Needles:  Injection technique: Single-shot  Needle Type: Echogenic Stimulator Needle     Needle Length: 9cm  Needle Gauge: 22     Additional Needles:   Procedures:,,,, ultrasound used (permanent image in chart),,    Narrative:  Start time: 07/12/2023 2:00 PM End time: 07/12/2023 2:02 PM Injection made incrementally with aspirations every 5 mL.  Performed by: Personally  Anesthesiologist: Vernadine Golas, MD  Additional Notes: Risks, benefits, and alternative discussed. Patient gave consent for procedure. Patient prepped and draped in sterile fashion. Sedation administered, patient remains easily responsive to voice. Relevant anatomy identified with ultrasound guidance. Local anesthetic given in 5cc increments with no signs or symptoms of intravascular injection. No pain or paraesthesias with injection. Patient monitored throughout procedure with signs of LAST or immediate complications. Tolerated well. Ultrasound image placed in chart.  Amador Junes, MD

## 2023-07-12 NOTE — Anesthesia Preprocedure Evaluation (Addendum)
 Anesthesia Evaluation  Patient identified by MRN, date of birth, ID band Patient awake    Reviewed: Allergy & Precautions, NPO status , Patient's Chart, lab work & pertinent test results  History of Anesthesia Complications (+) PONV and history of anesthetic complications  Airway Mallampati: II  TM Distance: >3 FB Neck ROM: Full    Dental  (+) Edentulous Lower, Edentulous Upper   Pulmonary COPD,  COPD inhaler, Current Smoker   Pulmonary exam normal        Cardiovascular hypertension, Pt. on medications Normal cardiovascular exam     Neuro/Psych   Anxiety Depression    negative neurological ROS     GI/Hepatic Neg liver ROS,GERD  Medicated and Controlled,,  Endo/Other  negative endocrine ROS    Renal/GU negative Renal ROS     Musculoskeletal negative musculoskeletal ROS (+)    Abdominal   Peds  Hematology negative hematology ROS (+)   Anesthesia Other Findings Malignant neoplasm of upper-outer quadrant of right breast in female  Reproductive/Obstetrics negative OB ROS                              Anesthesia Physical Anesthesia Plan  ASA: 2  Anesthesia Plan: General   Post-op Pain Management: Tylenol  PO (pre-op)* and Regional block*   Induction: Intravenous  PONV Risk Score and Plan: 4 or greater and Treatment may vary due to age or medical condition, Ondansetron  and Dexamethasone   Airway Management Planned: LMA  Additional Equipment: None  Intra-op Plan:   Post-operative Plan: Extubation in OR  Informed Consent: I have reviewed the patients History and Physical, chart, labs and discussed the procedure including the risks, benefits and alternatives for the proposed anesthesia with the patient or authorized representative who has indicated his/her understanding and acceptance.     Dental advisory given  Plan Discussed with: CRNA  Anesthesia Plan Comments:          Anesthesia Quick Evaluation

## 2023-07-13 ENCOUNTER — Encounter (HOSPITAL_COMMUNITY): Payer: Self-pay | Admitting: General Surgery

## 2023-07-13 NOTE — Anesthesia Postprocedure Evaluation (Signed)
 Anesthesia Post Note  Patient: Amy Lucas  Procedure(s) Performed: RIGHT BREAST LUMPECTOMY WITH RADIOACTIVE SEED LOCALIZATION (Right: Breast) RIGHT AXILLARY SENTINEL LYMPH NODE BIOPSY (Right)     Patient location during evaluation: PACU Anesthesia Type: General Level of consciousness: awake and alert, oriented and patient cooperative Pain management: pain level controlled Vital Signs Assessment: post-procedure vital signs reviewed and stable Respiratory status: spontaneous breathing, nonlabored ventilation and respiratory function stable Cardiovascular status: blood pressure returned to baseline and stable Postop Assessment: no apparent nausea or vomiting Anesthetic complications: no   No notable events documented.  Last Vitals:  Vitals:   07/12/23 1745 07/12/23 1800  BP: 130/65 130/62  Pulse: 73 86  Resp: 14 15  Temp:  36.8 C  SpO2: 96% 92%    Last Pain:  Vitals:   07/12/23 1800  TempSrc:   PainSc: 0-No pain   Pain Goal:                   [name]

## 2023-07-17 LAB — SURGICAL PATHOLOGY

## 2023-07-19 ENCOUNTER — Telehealth: Payer: Self-pay | Admitting: Radiation Oncology

## 2023-07-19 ENCOUNTER — Encounter: Payer: Self-pay | Admitting: *Deleted

## 2023-07-19 DIAGNOSIS — Z170 Estrogen receptor positive status [ER+]: Secondary | ICD-10-CM

## 2023-07-19 NOTE — Telephone Encounter (Signed)
 Called patient's daughter (on patient's request) to schedule consult per 5/8 referral. Patient's daughter stated they wanted to pursue treatment at [place] instead. Sent message to Dr. Alix Aquas team to update referral.

## 2023-07-26 ENCOUNTER — Ambulatory Visit: Admitting: Hematology and Oncology

## 2023-07-30 ENCOUNTER — Encounter: Payer: Self-pay | Admitting: Radiation Oncology

## 2023-07-30 ENCOUNTER — Ambulatory Visit
Admission: RE | Admit: 2023-07-30 | Discharge: 2023-07-30 | Disposition: A | Discharge status: Home / Self Care | Source: Ambulatory Visit | Attending: Radiation Oncology | Admitting: Radiation Oncology

## 2023-07-30 DIAGNOSIS — Z170 Estrogen receptor positive status [ER+]: Secondary | ICD-10-CM | POA: Diagnosis not present

## 2023-07-30 DIAGNOSIS — Z7951 Long term (current) use of inhaled steroids: Secondary | ICD-10-CM | POA: Diagnosis not present

## 2023-07-30 DIAGNOSIS — Z79811 Long term (current) use of aromatase inhibitors: Secondary | ICD-10-CM | POA: Insufficient documentation

## 2023-07-30 DIAGNOSIS — F17210 Nicotine dependence, cigarettes, uncomplicated: Secondary | ICD-10-CM | POA: Diagnosis not present

## 2023-07-30 DIAGNOSIS — C50411 Malignant neoplasm of upper-outer quadrant of right female breast: Secondary | ICD-10-CM | POA: Diagnosis present

## 2023-07-30 DIAGNOSIS — K219 Gastro-esophageal reflux disease without esophagitis: Secondary | ICD-10-CM | POA: Diagnosis not present

## 2023-07-30 DIAGNOSIS — J449 Chronic obstructive pulmonary disease, unspecified: Secondary | ICD-10-CM | POA: Insufficient documentation

## 2023-07-30 DIAGNOSIS — Z79899 Other long term (current) drug therapy: Secondary | ICD-10-CM | POA: Insufficient documentation

## 2023-07-30 DIAGNOSIS — Z8041 Family history of malignant neoplasm of ovary: Secondary | ICD-10-CM | POA: Diagnosis not present

## 2023-07-30 DIAGNOSIS — E7800 Pure hypercholesterolemia, unspecified: Secondary | ICD-10-CM | POA: Insufficient documentation

## 2023-07-30 DIAGNOSIS — Z803 Family history of malignant neoplasm of breast: Secondary | ICD-10-CM | POA: Diagnosis not present

## 2023-07-30 DIAGNOSIS — I10 Essential (primary) hypertension: Secondary | ICD-10-CM | POA: Insufficient documentation

## 2023-08-02 NOTE — Progress Notes (Signed)
 Radiation Oncology         [phone] ________________________________  Name: Amy Lucas        MRN: [id number]  Date of Service: 07/30/2023 DOB: June 30, 1945  BM:[id number], Annabella Keys, NP       REFERRING PHYSICIAN: Enid Harry, MD   DIAGNOSIS: The encounter diagnosis was Malignant neoplasm of upper-outer quadrant of right female breast, unspecified estrogen receptor status (HCC).   HISTORY OF PRESENT ILLNESS: Amy Lucas is a 78 y.o. female seen at the request of Dr. Delane Fear.  Late last year, she was found to have an abnormality in her right breast; biopsy was performed, with pathology revealing invasive lobular carcinoma, grade 2.  She was ultimately seen in consultation by Dr. Delane Fear, who performed right breast lumpectomy and sentinel lymph node sampling earlier this month.  Pathology revealed invasive lobular carcinoma, measuring 3.5 cm in greatest dimension.  Her carcinoma was ER/PR positive, and HER2 negative.  4 lymph nodes were sampled; all were negative for carcinoma involvement.  Adjuvant chemotherapy has not been recommended.  Consultation is requested regarding the potential role of radiation in her care.    PREVIOUS RADIATION THERAPY: No   PAST MEDICAL HISTORY:  Past Medical History:  Diagnosis Date   Anxiety    Bladder infection    Blood type, Rh negative    Breast cancer (HCC)    COPD (chronic obstructive pulmonary disease) (HCC)    GERD (gastroesophageal reflux disease)    Hernia    High cholesterol    History of chicken pox    History of measles    Hypertension    Ovarian cyst    PONV (postoperative nausea and vomiting)    Vaginal dryness        PAST SURGICAL HISTORY: Past Surgical History:  Procedure Laterality Date   AXILLARY SENTINEL NODE BIOPSY Right 07/12/2023   Procedure: RIGHT AXILLARY SENTINEL LYMPH NODE BIOPSY;  Surgeon: Enid Harry, MD;  Location: [facility];  Service: General;  Laterality: Right;   BREAST BIOPSY Right  12/12/2022   US  RT BREAST BX W LOC DEV 1ST LESION IMG BX SPEC US  GUIDE 12/12/2022 [facility]   BREAST BIOPSY Right 12/12/2022   US  RT BREAST BX W LOC DEV EA ADD LESION IMG BX SPEC US  GUIDE 12/12/2022 [facility]   BREAST BIOPSY Right 12/18/2022   MM RT BREAST BX W LOC DEV 1ST LESION IMAGE BX SPEC STEREO GUIDE 12/18/2022 [facility]   BREAST BIOPSY  07/10/2023   MM RT RADIOACTIVE SEED EA ADD LESION LOC MAMMO GUIDE 07/10/2023 [facility]   BREAST BIOPSY  07/10/2023   MM RT RADIOACTIVE SEED EA ADD LESION LOC MAMMO GUIDE 07/10/2023 [facility]   BREAST BIOPSY  07/10/2023   MM RT RADIOACTIVE SEED LOC MAMMO GUIDE 07/10/2023 [facility]   BREAST LUMPECTOMY WITH RADIOACTIVE SEED LOCALIZATION Right 07/12/2023   Procedure: RIGHT BREAST LUMPECTOMY WITH RADIOACTIVE SEED LOCALIZATION;  Surgeon: Enid Harry, MD;  Location: [facility];  Service: General;  Laterality: Right;  RIGHT BREAST SEED GUIDED LUMPECTOMIES RIGHT AXILLARY SENTINEL NODE BIOPSY MAGTRACE INJECTION   EYE SURGERY Bilateral    cataract   HEMORRHOID SURGERY     NASAL SINUS SURGERY     TRIGGER FINGER RELEASE Right      FAMILY HISTORY:  Family History  Problem Relation Age of Onset   Heart disease Mother    Hypertension Mother    Heart failure Mother    Breast cancer Maternal Aunt  dx 30s-40s   Ovarian cancer Maternal Aunt        d. 59s   Brain cancer Maternal Aunt        d. 50s-60s   Melanoma Maternal Uncle        scalp; mets; d. >50   Breast cancer Maternal Grandmother        dx >50     SOCIAL HISTORY:  reports that she has been smoking cigarettes. She started smoking about 55 years ago. She has a 83.1 pack-year smoking history. She has never used smokeless tobacco. She reports that she does not currently use alcohol. She reports that she does not use drugs.   ALLERGIES: Sulfa antibiotics and Triamcinolone acetonide   MEDICATIONS:  Current Outpatient Medications  Medication  Sig Dispense Refill   Acetaminophen  (TYLENOL  PO) Take 2 tablets by mouth as needed (pain/headache).     albuterol (PROAIR HFA) 108 (90 Base) MCG/ACT inhaler Inhale 2 puffs into the lungs daily as needed for wheezing or shortness of breath.     ALPRAZolam (XANAX) 1 MG tablet Take 1 mg by mouth in the morning, at noon, in the evening, and at bedtime.     amLODipine (NORVASC) 2.5 MG tablet Take 2.5 mg by mouth daily.     anastrozole  (ARIMIDEX ) 1 MG tablet Take 1 tablet (1 mg total) by mouth daily. 90 tablet 3   cetirizine (ZYRTEC) 10 MG tablet Take 10 mg by mouth daily as needed for allergies.     docusate sodium (COLACE) 100 MG capsule Take 200 mg by mouth daily.     Fluticasone-Salmeterol (ADVAIR) 250-50 MCG/DOSE AEPB Inhale into the lungs. (Patient not taking: Reported on 12/20/2022)     ibandronate (BONIVA) 150 MG tablet TK 1 T PO Q MONTH (Patient not taking: Reported on 07/10/2023)     ipratropium-albuterol (DUONEB) 0.5-2.5 (3) MG/3ML SOLN Inhale 3 mLs into the lungs. (Patient not taking: Reported on 07/10/2023)     meclizine  (ANTIVERT ) 25 MG tablet Take 1 tablet (25 mg total) by mouth 3 (three) times daily as needed for dizziness. (Patient taking differently: Take 25 mg by mouth daily as needed for dizziness.) 30 tablet 0   omeprazole (PRILOSEC) 40 MG capsule Take 40 mg by mouth daily.     PARoxetine (PAXIL) 30 MG tablet Take 30 mg by mouth.     polyethylene glycol powder (GLYCOLAX/MIRALAX) powder 17 g daily as needed for mild constipation or moderate constipation.     simvastatin (ZOCOR) 20 MG tablet Take 20 mg by mouth at bedtime.     No current facility-administered medications for this encounter.     REVIEW OF SYSTEMS: On review of systems, the patient reports that she is doing well overall.  She denies any chest pain, shortness of breath, cough, fevers, chills, night sweats, unintended weight changes.  She denies any bowel or bladder disturbances, and denies abdominal pain, nausea or  vomiting.  She denies any new musculoskeletal or joint aches or pains.  She still reports mild to moderate tenderness in her right axilla.  She reports no nipple bleeding or discharge.  A complete review of systems is obtained and is otherwise negative.     PHYSICAL EXAM:  Wt Readings from Last 3 Encounters:  07/12/23 129 lb (58.5 kg)  07/10/23 129 lb 6.4 oz (58.7 kg)  12/20/22 128 lb 12.8 oz (58.4 kg)   Temp Readings from Last 3 Encounters:  07/12/23 98.3 F (36.8 C)  07/10/23 98.1 F (36.7 C)  12/20/22 Aaron Aas)  97.2 F (36.2 C) (Temporal)   BP Readings from Last 3 Encounters:  07/12/23 130/62  07/10/23 124/75  12/20/22 128/85   Pulse Readings from Last 3 Encounters:  07/12/23 86  07/10/23 87  12/20/22 97   Pain Assessment Pain Score: 7 /10  In general this is a well appearing female in no acute distress.  She's alert and oriented x4 and appropriate throughout the examination. Cardiopulmonary assessment is negative for acute distress and she exhibits normal effort.  No cervical, supraclavicular, or axillary lymphadenopathy is appreciated.  Her left breast is without masses, erythema, or edema.  Examination of her right breast reveals her well-healed lumpectomy incision.  Slight firmness and mild erythema are noted in her right axilla.    ECOG = 0  0 - Asymptomatic (Fully active, able to carry on all predisease activities without restriction)  1 - Symptomatic but completely ambulatory (Restricted in physically strenuous activity but ambulatory and able to carry out work of a light or sedentary nature. For example, light housework, office work)  2 - Symptomatic, <50% in bed during the day (Ambulatory and capable of all self care but unable to carry out any work activities. Up and about more than 50% of waking hours)  3 - Symptomatic, >50% in bed, but not bedbound (Capable of only limited self-care, confined to bed or chair 50% or more of waking hours)  4 - Bedbound (Completely  disabled. Cannot carry on any self-care. Totally confined to bed or chair)  5 - Death   Aurea Blossom MM, Creech RH, Tormey DC, et al. [phone]). "Toxicity and response criteria of the [facility]". Am. Hillard Lowes. Oncol. 5 (6): 649-55    LABORATORY DATA:  Lab Results  Component Value Date   WBC 7.9 07/10/2023   HGB 14.5 07/10/2023   HCT 43.7 07/10/2023   MCV 93.8 07/10/2023   PLT 207 07/10/2023   Lab Results  Component Value Date   NA 140 07/10/2023   K 4.1 07/10/2023   CL 104 07/10/2023   CO2 29 07/10/2023   Lab Results  Component Value Date   ALT 7 12/20/2022   AST 12 (L) 12/20/2022   ALKPHOS 50 12/20/2022   BILITOT 0.3 12/20/2022      RADIOGRAPHY: MM Breast Surgical Specimen Result Date: 07/12/2023 CLINICAL DATA:  Evaluate surgical specimen following lumpectomy for RIGHT breast cancers. EXAM: SPECIMEN RADIOGRAPH OF THE RIGHT BREAST COMPARISON:  Previous exam(s). FINDINGS: Status post excision of the RIGHT breast. The 3 radioactive seeds and COIL, BARBELL, RIBBON and X biopsy clips are present and intact. IMPRESSION: Specimen radiograph of the RIGHT breast. Electronically Signed   By: Sundra Engel M.D.   On: 07/12/2023 16:39   MM RT RADIO SEED EA ADD LESION LOC MAMMO Result Date: 07/10/2023 CLINICAL DATA:  Patient presents for radioactive seed localization 3 areas carcinoma in the right breast prior to surgical excision. EXAM: MAMMOGRAPHIC GUIDED RADIOACTIVE SEED LOCALIZATION OF THE RIGHT BREAST: 3 RADIOACTIVE SEEDS PLACED. COMPARISON:  Previous exam(s). FINDINGS: Patient presents for radioactive seed localization prior to surgical excision. I met with the patient and we discussed the procedure of seed localization including benefits and alternatives. We discussed the high likelihood of a successful procedure. We discussed the risks of the procedure including infection, bleeding, tissue injury and further surgery. We discussed the low dose of radioactivity involved in  the procedure. Informed, written consent was given. The usual time-out protocol was performed immediately prior to the procedure. Ribbon Clip Using mammographic guidance,  sterile technique, 1% lidocaine  and an I-125 radioactive seed, the ribbon shaped post biopsy marker clip was localized using a superior approach. The follow-up mammogram images confirm the seed in the expected location and were marked for Dr. Delane Fear. Follow-up survey of the patient confirms presence of the radioactive seed. Order number of I-125 seed:  [id number]. Total activity:  0.245 millicuries.  Reference Date: 06/25/2023. X Clip Using mammographic guidance, sterile technique, 1% lidocaine  and an I-125 radioactive seed, the X shaped post biopsy marker clip was localized using a superior approach. The follow-up mammogram images confirm the seed in the expected location and were marked for Dr. Delane Fear. Follow-up survey of the patient confirms presence of the radioactive seed. Order number of I-125 seed:  [id number]. Total activity:  0.236 millicuries.  Reference Date: 05/31/2023. Barbell Clip Using mammographic guidance, sterile technique, 1% lidocaine  and an I-125 radioactive seed, the barbell shaped post biopsy marker clip was localized using a superior approach. The follow-up mammogram images confirm the seed in the expected location and were marked for Dr. Delane Fear. Follow-up survey of the patient confirms presence of the radioactive seed. Order number of I-125 seed:  [id number]. Total activity:  0.236 millicuries.  Reference Date: 05/31/2023. The patient tolerated the procedure well and was [facility]. She was given instructions regarding seed removal. IMPRESSION: Radioactive seed localization of the right breast. Three sites localized. No apparent complications. Electronically Signed   By: Amanda Jungling M.D.   On: 07/10/2023 10:35   MM RT RADIOACTIVE SEED LOC MAMMO GUIDE Result Date: 07/10/2023 CLINICAL DATA:   Patient presents for radioactive seed localization 3 areas carcinoma in the right breast prior to surgical excision. EXAM: MAMMOGRAPHIC GUIDED RADIOACTIVE SEED LOCALIZATION OF THE RIGHT BREAST: 3 RADIOACTIVE SEEDS PLACED. COMPARISON:  Previous exam(s). FINDINGS: Patient presents for radioactive seed localization prior to surgical excision. I met with the patient and we discussed the procedure of seed localization including benefits and alternatives. We discussed the high likelihood of a successful procedure. We discussed the risks of the procedure including infection, bleeding, tissue injury and further surgery. We discussed the low dose of radioactivity involved in the procedure. Informed, written consent was given. The usual time-out protocol was performed immediately prior to the procedure. Ribbon Clip Using mammographic guidance, sterile technique, 1% lidocaine  and an I-125 radioactive seed, the ribbon shaped post biopsy marker clip was localized using a superior approach. The follow-up mammogram images confirm the seed in the expected location and were marked for Dr. Delane Fear. Follow-up survey of the patient confirms presence of the radioactive seed. Order number of I-125 seed:  [id number]. Total activity:  0.245 millicuries.  Reference Date: 06/25/2023. X Clip Using mammographic guidance, sterile technique, 1% lidocaine  and an I-125 radioactive seed, the X shaped post biopsy marker clip was localized using a superior approach. The follow-up mammogram images confirm the seed in the expected location and were marked for Dr. Delane Fear. Follow-up survey of the patient confirms presence of the radioactive seed. Order number of I-125 seed:  [id number]. Total activity:  0.236 millicuries.  Reference Date: 05/31/2023. Barbell Clip Using mammographic guidance, sterile technique, 1% lidocaine  and an I-125 radioactive seed, the barbell shaped post biopsy marker clip was localized using a superior approach. The follow-up  mammogram images confirm the seed in the expected location and were marked for Dr. Delane Fear. Follow-up survey of the patient confirms presence of the radioactive seed. Order number of I-125 seed:  [id number]. Total activity:  0.236 millicuries.  Reference Date: 05/31/2023.  The patient tolerated the procedure well and was released from the [facility]. She was given instructions regarding seed removal. IMPRESSION: Radioactive seed localization of the right breast. Three sites localized. No apparent complications. Electronically Signed   By: Amanda Jungling M.D.   On: 07/10/2023 10:35   MM RT RADIO SEED EA ADD LESION LOC MAMMO Result Date: 07/10/2023 CLINICAL DATA:  Patient presents for radioactive seed localization 3 areas carcinoma in the right breast prior to surgical excision. EXAM: MAMMOGRAPHIC GUIDED RADIOACTIVE SEED LOCALIZATION OF THE RIGHT BREAST: 3 RADIOACTIVE SEEDS PLACED. COMPARISON:  Previous exam(s). FINDINGS: Patient presents for radioactive seed localization prior to surgical excision. I met with the patient and we discussed the procedure of seed localization including benefits and alternatives. We discussed the high likelihood of a successful procedure. We discussed the risks of the procedure including infection, bleeding, tissue injury and further surgery. We discussed the low dose of radioactivity involved in the procedure. Informed, written consent was given. The usual time-out protocol was performed immediately prior to the procedure. Ribbon Clip Using mammographic guidance, sterile technique, 1% lidocaine  and an I-125 radioactive seed, the ribbon shaped post biopsy marker clip was localized using a superior approach. The follow-up mammogram images confirm the seed in the expected location and were marked for Dr. Delane Fear. Follow-up survey of the patient confirms presence of the radioactive seed. Order number of I-125 seed:  [id number]. Total activity:  0.245 millicuries.  Reference Date:  06/25/2023. X Clip Using mammographic guidance, sterile technique, 1% lidocaine  and an I-125 radioactive seed, the X shaped post biopsy marker clip was localized using a superior approach. The follow-up mammogram images confirm the seed in the expected location and were marked for Dr. Delane Fear. Follow-up survey of the patient confirms presence of the radioactive seed. Order number of I-125 seed:  [id number]. Total activity:  0.236 millicuries.  Reference Date: 05/31/2023. Barbell Clip Using mammographic guidance, sterile technique, 1% lidocaine  and an I-125 radioactive seed, the barbell shaped post biopsy marker clip was localized using a superior approach. The follow-up mammogram images confirm the seed in the expected location and were marked for Dr. Delane Fear. Follow-up survey of the patient confirms presence of the radioactive seed. Order number of I-125 seed:  [id number]. Total activity:  0.236 millicuries.  Reference Date: 05/31/2023. The patient tolerated the procedure well and was released from [facility]. She was given instructions regarding seed removal. IMPRESSION: Radioactive seed localization of the right breast. Three sites localized. No apparent complications. Electronically Signed   By: Amanda Jungling M.D.   On: 07/10/2023 10:35       IMPRESSION/PLAN: 1.  The patient is a 78 year old female status post right breast lumpectomy and sentinel lymph node sampling for a 3.5 cm invasive lobular carcinoma.  Surgical margins were clear.  Adjuvant chemotherapy has not been recommended.  I spoke at length with the patient and her son regarding the role of adjuvant radiation in the management of invasive carcinoma of the breast, postlumpectomy.  I have recommended to her that we proceed with adjuvant right breast radiation.  I explained the rationale behind this recommendation, as well as the potential side effects associated with adjuvant breast radiation in her clinical scenario.  I explained that  these may include, but are not limited to, skin irritation/breakdown, and fatigue.  I explained that potential long-term side effects of radiation may include, but are not limited to, damage to a small amount of her right lung.  Of note, she is scheduled to  see Dr. Delane Fear in follow-up on May 30; I would like for him to examine the patient's right axilla given its swelling and erythema.  We will make arrangements for her to return to our department for simulation and treatment planning based on Dr. Quince Bryant assessment.   In a visit lasting 60 minutes, greater than 50% of the time was spent face to face discussing the patient's condition, in preparation for the discussion, and coordinating the patient's care.    Elana Jian A. Cathren Coaster, MD   **Disclaimer: This note was dictated with voice recognition software. Similar sounding words can inadvertently be transcribed and this note may contain transcription errors which may not have been corrected upon publication of note.**

## 2023-08-07 ENCOUNTER — Encounter: Payer: Self-pay | Admitting: *Deleted

## 2023-08-08 ENCOUNTER — Inpatient Hospital Stay: Attending: Hematology and Oncology | Admitting: Hematology and Oncology

## 2023-08-08 VITALS — BP 112/76 | HR 97 | Temp 97.4°F | Resp 16 | Ht 62.0 in | Wt 128.5 lb

## 2023-08-08 DIAGNOSIS — Z1732 Human epidermal growth factor receptor 2 negative status: Secondary | ICD-10-CM | POA: Insufficient documentation

## 2023-08-08 DIAGNOSIS — Z170 Estrogen receptor positive status [ER+]: Secondary | ICD-10-CM | POA: Insufficient documentation

## 2023-08-08 DIAGNOSIS — Z79811 Long term (current) use of aromatase inhibitors: Secondary | ICD-10-CM | POA: Insufficient documentation

## 2023-08-08 DIAGNOSIS — Z1721 Progesterone receptor positive status: Secondary | ICD-10-CM | POA: Diagnosis not present

## 2023-08-08 DIAGNOSIS — C50411 Malignant neoplasm of upper-outer quadrant of right female breast: Secondary | ICD-10-CM | POA: Insufficient documentation

## 2023-08-08 NOTE — Progress Notes (Signed)
 Patient Care Team: Despina Floro, NP as PCP - General (Internal Medicine) Auther Bo, RN as Registered Nurse Alane Hsu, RN as Registered Nurse Enid Harry, MD as Consulting Physician (General Surgery) Cameron Cea, MD as Consulting Physician (Hematology and Oncology) Johna Myers, MD as Consulting Physician (Radiation Oncology)  DIAGNOSIS:  Encounter Diagnosis  Name Primary?   Malignant neoplasm of upper-outer quadrant of right breast in female, estrogen receptor positive (HCC) Yes    SUMMARY OF ONCOLOGIC HISTORY: Oncology History  Malignant neoplasm of upper-outer quadrant of right breast in female, estrogen receptor positive (HCC)  12/18/2022 Initial Diagnosis   Screening mammogram detected right breast mass and asymmetry 11:00: 1.1 cm: Stereotactic biopsy: Grade 2 ILC ER 100%, PR 100%, Ki67 3%, HER2 negative, 12 o'clock position: 1.1 cm: Biopsy benign concordant, third biopsy also positive for grade 2 invasive lobular cancer, axilla negative   12/18/2022 Cancer Staging   Staging form: Breast, AJCC 8th Edition - Clinical stage from 12/18/2022: Stage IA (cT1c, cN0, cM0, G2, ER+, PR+, HER2-) - Signed by Bettejane Brownie, PA-C on 12/18/2022 Stage prefix: Initial diagnosis Method of lymph node assessment: Clinical Histologic grading system: 3 grade system   07/12/2023 Surgery   Right lumpectomy: Grade 2 invasive lobular carcinoma 3.5 cm, margins negative, 0/4 lymph nodes negative, ER 60% and 100%, PR 100%, HER2 1+ negative, Ki-67 3 to 5%     CHIEF COMPLIANT: Follow-up after recent breast surgery  HISTORY OF PRESENT ILLNESS: History of Present Illness Amy Lucas is a 78 year old female who presents for follow-up after breast cancer surgery.  She underwent breast cancer surgery on Jul 12, 2023, after successful tumor reduction treatment over six months. Post-operatively, she wears a compression garment to manage swelling and fluid accumulation,  which causes discomfort under her arm. She experiences no significant pain and is managing well. She continues on anastrozole  1 mg orally daily as part of her adjuvant hormonal therapy.     ALLERGIES:  is allergic to sulfa antibiotics and triamcinolone acetonide.  MEDICATIONS:  Current Outpatient Medications  Medication Sig Dispense Refill   ALPRAZolam (XANAX) 1 MG tablet Take 1 mg by mouth in the morning, at noon, in the evening, and at bedtime.     amLODipine (NORVASC) 2.5 MG tablet Take 2.5 mg by mouth daily.     anastrozole  (ARIMIDEX ) 1 MG tablet Take 1 tablet (1 mg total) by mouth daily. 90 tablet 3   omeprazole (PRILOSEC) 40 MG capsule Take 40 mg by mouth daily.     PARoxetine (PAXIL) 30 MG tablet Take 30 mg by mouth.     simvastatin (ZOCOR) 20 MG tablet Take 20 mg by mouth at bedtime.     Acetaminophen  (TYLENOL  PO) Take 2 tablets by mouth as needed (pain/headache). (Patient not taking: Reported on 08/08/2023)     albuterol (PROAIR HFA) 108 (90 Base) MCG/ACT inhaler Inhale 2 puffs into the lungs daily as needed for wheezing or shortness of breath. (Patient not taking: Reported on 08/08/2023)     cetirizine (ZYRTEC) 10 MG tablet Take 10 mg by mouth daily as needed for allergies. (Patient not taking: Reported on 08/08/2023)     docusate sodium (COLACE) 100 MG capsule Take 200 mg by mouth daily. (Patient not taking: Reported on 08/08/2023)     Fluticasone-Salmeterol (ADVAIR) 250-50 MCG/DOSE AEPB Inhale into the lungs. (Patient not taking: Reported on 12/20/2022)     ibandronate (BONIVA) 150 MG tablet TK 1 T PO Q MONTH (Patient not taking:  Reported on 07/10/2023)     ipratropium-albuterol (DUONEB) 0.5-2.5 (3) MG/3ML SOLN Inhale 3 mLs into the lungs. (Patient not taking: Reported on 07/10/2023)     meclizine  (ANTIVERT ) 25 MG tablet Take 1 tablet (25 mg total) by mouth 3 (three) times daily as needed for dizziness. (Patient not taking: Reported on 08/08/2023) 30 tablet 0   polyethylene glycol powder  (GLYCOLAX/MIRALAX) powder 17 g daily as needed for mild constipation or moderate constipation. (Patient not taking: Reported on 08/08/2023)     No current facility-administered medications for this visit.    PHYSICAL EXAMINATION: ECOG PERFORMANCE STATUS: 1 - Symptomatic but completely ambulatory  Vitals:   08/08/23 0843  BP: 112/76  Pulse: 97  Resp: 16  Temp: (!) 97.4 F (36.3 C)  SpO2: 95%   Filed Weights   08/08/23 0843  Weight: 128 lb 8 oz (58.3 kg)    LABORATORY DATA:  I have reviewed the data as listed    Latest Ref Rng & Units 07/10/2023   11:55 AM 12/20/2022   12:12 PM 07/23/2020    3:54 PM  CMP  Glucose 70 - 99 mg/dL 94  161  096   BUN 8 - 23 mg/dL 9  8  12    Creatinine 0.44 - 1.00 mg/dL 0.45  4.09  8.11   Sodium 135 - 145 mmol/L 140  140  135   Potassium 3.5 - 5.1 mmol/L 4.1  3.8  3.7   Chloride 98 - 111 mmol/L 104  106  103   CO2 22 - 32 mmol/L 29  27  21    Calcium 8.9 - 10.3 mg/dL 9.2  9.2  9.1   Total Protein 6.5 - 8.1 g/dL  7.2  7.4   Total Bilirubin 0.3 - 1.2 mg/dL  0.3  0.6   Alkaline Phos 38 - 126 U/L  50  60   AST 15 - 41 U/L  12  15   ALT 0 - 44 U/L  7  12     Lab Results  Component Value Date   WBC 7.9 07/10/2023   HGB 14.5 07/10/2023   HCT 43.7 07/10/2023   MCV 93.8 07/10/2023   PLT 207 07/10/2023   NEUTROABS 4.3 12/20/2022    ASSESSMENT & PLAN:  Malignant neoplasm of upper-outer quadrant of right breast in female, estrogen receptor positive (HCC) 12/18/2022: Screening mammogram detected right breast mass and asymmetry 11:00: 1.1 cm: Stereotactic biopsy: Grade 2 ILC ER 100%, PR 100%, Ki67 3%, HER2 negative, 12 o'clock position: 1.1 cm: Biopsy benign concordant, third biopsy also positive for grade 2 invasive lobular cancer, axilla negative  Breast MRI: 12/25/2022: 2 areas of enhancement  12/18/2022-07/12/2023: Neoadjuvant antiestrogen therapy  07/12/2023: Right lumpectomy: Grade 2 invasive lobular carcinoma 3.5 cm, margins negative, 0/4 lymph  nodes negative, ER 60% and 100%, PR 100%, HER2 1+ negative, Ki-67 3 to 5%  Pathology counseling: I discussed the final pathology report of the patient provided  a copy of this report. I discussed the margins as well as lymph node surgeries. We also discussed the final staging along with previously performed ER/PR and HER-2/neu testing.  Treatment plan: Adjuvant radiation therapy Continued antiestrogen therapy  Return to clinic after radiation is completed ------------------------------------- Assessment and Plan Assessment & Plan Breast cancer Malignant neoplasm of the upper-outer quadrant of the right breast, estrogen receptor positive. Post-surgery pathology confirmed stage one cancer with clear lymph nodes. Currently cancer-free. - Continue anastrozole  1 mg orally daily for 5-7 years. - Advise padding the  compression garment with soft material to prevent irritation. - Permit removal of the compression garment as it is no longer needed. - Follow up with Dr. Markus Sill on Friday for further evaluation and potential release from the compression garment.      No orders of the defined types were placed in this encounter.  The patient has a good understanding of the overall plan. she agrees with it. she will call with any problems that may develop before the next visit here. Total time spent: 30 mins including face to face time and time spent for planning, charting and co-ordination of care   Margert Sheerer, MD 08/08/23

## 2023-08-08 NOTE — Assessment & Plan Note (Signed)
 12/18/2022: Screening mammogram detected right breast mass and asymmetry 11:00: 1.1 cm: Stereotactic biopsy: Grade 2 ILC ER 100%, PR 100%, Ki67 3%, HER2 negative, 12 o'clock position: 1.1 cm: Biopsy benign concordant, third biopsy also positive for grade 2 invasive lobular cancer, axilla negative  Breast MRI: 12/25/2022: 2 areas of enhancement  12/18/2022-07/12/2023: Neoadjuvant antiestrogen therapy  07/12/2023: Right lumpectomy: Grade 2 invasive lobular carcinoma 3.5 cm, margins negative, 0/4 lymph nodes negative, ER 60% and 100%, PR 100%, HER2 1+ negative, Ki-67 3 to 5%  Pathology counseling: I discussed the final pathology report of the patient provided  a copy of this report. I discussed the margins as well as lymph node surgeries. We also discussed the final staging along with previously performed ER/PR and HER-2/neu testing.  Treatment plan: Adjuvant radiation therapy Continued antiestrogen therapy  Return to clinic after radiation is completed

## 2023-08-15 ENCOUNTER — Encounter: Payer: Self-pay | Admitting: *Deleted

## 2023-08-21 ENCOUNTER — Encounter: Payer: Self-pay | Admitting: *Deleted

## 2023-08-21 NOTE — Telephone Encounter (Signed)
-----   Message from Nurse Birdena Buggy sent at 08/15/2023  4:01 PM EDT ----- Regarding: RE: xrt in [place] Looks like she saw Dr. Delane Fear 5/30 and she should be good to proceed with xrt according to his note.  Could you let me know when this patient is scheduled back to see start xrt.  Thanks, ----- Message ----- From: Shelbie Dess, RN Sent: 07/30/2023   8:51 AM EDT To: Alane Hsu, RN; Coralyn Derry Subject: RE: xrt in [facility]                             Jul 30, 2023 @ 1:30pm ----- Message ----- From: Alane Hsu, RN Sent: 07/27/2023   2:03 PM EDT To: Coralyn Derry; Shelbie Dess, RN Subject: RE: xrt in [place]. Suella Em[place]. Could you give me the date please?  Thanks, Beauford Bounds ----- Message ----- From: Coralyn Derry Sent: 07/27/2023  11:27 AM EDT To: Alane Hsu, RN; Shelbie Dess, RN Subject: RE: xrt in [place] with pts daughter, she has been scheduled for her consult with Dr. Cathren Coaster ----- Message ----- From: Shelbie Dess, RN Sent: 07/25/2023   9:23 AM EDT To: Coralyn Derry Subject: FW: xrt in [place]                            Have you received this referral? ----- Message ----- From: Alane Hsu, RN Sent: 07/19/2023   2:44 PM EDT To: Auther Bo, RN; Roddie Cisco; # Subject: RE: xrt in [place]                            I have placed a referral for Dr. Cathren Coaster in [facility].  Just wanted to make we are supposed to still place a referral for med onc - [place], then in scheduling instructions state it's for rad onc.  Beauford Bounds ----- Message ----- From: Auther Bo, RN Sent: 07/19/2023  12:51 PM EDT To: Alane Hsu, RN; Roddie Cisco; # Subject: xrt in [place]                                HI Channa Hazelett,  Ms Marcos would like xrt with you all as she lives in [address]

## 2023-08-23 ENCOUNTER — Encounter: Payer: Self-pay | Admitting: *Deleted

## 2023-08-30 ENCOUNTER — Ambulatory Visit
Admission: RE | Admit: 2023-08-30 | Discharge: 2023-08-30 | Disposition: A | Discharge status: Home / Self Care | Source: Ambulatory Visit | Attending: Radiation Oncology | Admitting: Radiation Oncology

## 2023-08-30 DIAGNOSIS — C50411 Malignant neoplasm of upper-outer quadrant of right female breast: Secondary | ICD-10-CM | POA: Diagnosis present

## 2023-08-30 DIAGNOSIS — Z510 Encounter for antineoplastic radiation therapy: Secondary | ICD-10-CM | POA: Insufficient documentation

## 2023-09-03 DIAGNOSIS — Z510 Encounter for antineoplastic radiation therapy: Secondary | ICD-10-CM | POA: Diagnosis not present

## 2023-09-06 ENCOUNTER — Encounter: Payer: Self-pay | Admitting: *Deleted

## 2023-09-06 ENCOUNTER — Ambulatory Visit
Admission: RE | Admit: 2023-09-06 | Discharge: 2023-09-06 | Disposition: A | Discharge status: Home / Self Care | Source: Ambulatory Visit | Attending: Radiation Oncology | Admitting: Radiation Oncology

## 2023-09-06 ENCOUNTER — Other Ambulatory Visit: Payer: Self-pay

## 2023-09-06 DIAGNOSIS — Z510 Encounter for antineoplastic radiation therapy: Secondary | ICD-10-CM | POA: Diagnosis not present

## 2023-09-06 DIAGNOSIS — Z170 Estrogen receptor positive status [ER+]: Secondary | ICD-10-CM

## 2023-09-06 LAB — RAD ONC ARIA SESSION SUMMARY
Course Elapsed Days: 0
Plan Fractions Treated to Date: 1
Plan Prescribed Dose Per Fraction: 5.7 Gy
Plan Total Fractions Prescribed: 5
Plan Total Prescribed Dose: 28.5 Gy
Reference Point Dosage Given to Date: 5.7 Gy
Reference Point Session Dosage Given: 5.7 Gy
Session Number: 1

## 2023-09-13 ENCOUNTER — Other Ambulatory Visit: Payer: Self-pay

## 2023-09-13 ENCOUNTER — Ambulatory Visit
Admission: RE | Admit: 2023-09-13 | Discharge: 2023-09-13 | Disposition: A | Discharge status: Home / Self Care | Source: Ambulatory Visit | Attending: Radiation Oncology | Admitting: Radiation Oncology

## 2023-09-13 DIAGNOSIS — Z510 Encounter for antineoplastic radiation therapy: Secondary | ICD-10-CM | POA: Insufficient documentation

## 2023-09-13 DIAGNOSIS — C50411 Malignant neoplasm of upper-outer quadrant of right female breast: Secondary | ICD-10-CM | POA: Diagnosis present

## 2023-09-13 LAB — RAD ONC ARIA SESSION SUMMARY
Course Elapsed Days: 7
Plan Fractions Treated to Date: 2
Plan Prescribed Dose Per Fraction: 5.7 Gy
Plan Total Fractions Prescribed: 5
Plan Total Prescribed Dose: 28.5 Gy
Reference Point Dosage Given to Date: 11.4 Gy
Reference Point Session Dosage Given: 5.7 Gy
Session Number: 2

## 2023-09-19 ENCOUNTER — Ambulatory Visit
Admission: RE | Admit: 2023-09-19 | Discharge: 2023-09-19 | Disposition: A | Discharge status: Home / Self Care | Source: Ambulatory Visit | Attending: Radiation Oncology | Admitting: Radiation Oncology

## 2023-09-19 ENCOUNTER — Other Ambulatory Visit: Payer: Self-pay

## 2023-09-19 DIAGNOSIS — Z510 Encounter for antineoplastic radiation therapy: Secondary | ICD-10-CM | POA: Diagnosis not present

## 2023-09-19 LAB — RAD ONC ARIA SESSION SUMMARY
Course Elapsed Days: 13
Plan Fractions Treated to Date: 3
Plan Prescribed Dose Per Fraction: 5.7 Gy
Plan Total Fractions Prescribed: 5
Plan Total Prescribed Dose: 28.5 Gy
Reference Point Dosage Given to Date: 17.1 Gy
Reference Point Session Dosage Given: 5.7 Gy
Session Number: 3

## 2023-09-20 ENCOUNTER — Ambulatory Visit

## 2023-09-27 ENCOUNTER — Other Ambulatory Visit: Payer: Self-pay

## 2023-09-27 ENCOUNTER — Ambulatory Visit
Admission: RE | Admit: 2023-09-27 | Discharge: 2023-09-27 | Disposition: A | Discharge status: Home / Self Care | Source: Ambulatory Visit | Attending: Radiation Oncology

## 2023-09-27 DIAGNOSIS — Z510 Encounter for antineoplastic radiation therapy: Secondary | ICD-10-CM | POA: Diagnosis not present

## 2023-09-27 LAB — RAD ONC ARIA SESSION SUMMARY
Course Elapsed Days: 21
Plan Fractions Treated to Date: 4
Plan Prescribed Dose Per Fraction: 5.7 Gy
Plan Total Fractions Prescribed: 5
Plan Total Prescribed Dose: 28.5 Gy
Reference Point Dosage Given to Date: 22.8 Gy
Reference Point Session Dosage Given: 5.7 Gy
Session Number: 4

## 2023-09-30 NOTE — Therapy (Incomplete)
 OUTPATIENT PHYSICAL THERAPY BREAST CANCER POST OP FOLLOW UP   Patient Name: Amy Lucas MRN: [id number] DOB:1946-02-14, 78 y.o., female Today's Date: 09/30/2023  END OF SESSION:   Past Medical History:  Diagnosis Date   Anxiety    Bladder infection    Blood type, Rh negative    Breast cancer (HCC)    COPD (chronic obstructive pulmonary disease) (HCC)    GERD (gastroesophageal reflux disease)    Hernia    High cholesterol    History of chicken pox    History of measles    Hypertension    Ovarian cyst    PONV (postoperative nausea and vomiting)    Vaginal dryness    Past Surgical History:  Procedure Laterality Date   AXILLARY SENTINEL NODE BIOPSY Right 07/12/2023   Procedure: RIGHT AXILLARY SENTINEL LYMPH NODE BIOPSY;  Surgeon: Ebbie Cough, MD;  Location: [facility] OR;  Service: General;  Laterality: Right;   BREAST BIOPSY Right 12/12/2022   US  RT BREAST BX W LOC DEV 1ST LESION IMG BX SPEC US  GUIDE 12/12/2022 [facility]   BREAST BIOPSY Right 12/12/2022   US  RT BREAST BX W LOC DEV EA ADD LESION IMG BX SPEC US  GUIDE 12/12/2022 [facility]   BREAST BIOPSY Right 12/18/2022   MM RT BREAST BX W LOC DEV 1ST LESION IMAGE BX SPEC STEREO GUIDE 12/18/2022 [facility]   BREAST BIOPSY  07/10/2023   MM RT RADIOACTIVE SEED EA ADD LESION LOC MAMMO GUIDE 07/10/2023 [facility]   BREAST BIOPSY  07/10/2023   MM RT RADIOACTIVE SEED EA ADD LESION LOC MAMMO GUIDE 07/10/2023 [facility]   BREAST BIOPSY  07/10/2023   MM RT RADIOACTIVE SEED LOC MAMMO GUIDE 07/10/2023 [facility]   BREAST LUMPECTOMY WITH RADIOACTIVE SEED LOCALIZATION Right 07/12/2023   Procedure: RIGHT BREAST LUMPECTOMY WITH RADIOACTIVE SEED LOCALIZATION;  Surgeon: Ebbie Cough, MD;  Location: [facility] OR;  Service: General;  Laterality: Right;  RIGHT BREAST SEED GUIDED LUMPECTOMIES RIGHT AXILLARY SENTINEL NODE BIOPSY MAGTRACE INJECTION   EYE SURGERY Bilateral    cataract   HEMORRHOID SURGERY      NASAL SINUS SURGERY     TRIGGER FINGER RELEASE Right    Patient Active Problem List   Diagnosis Date Noted   Malignant neoplasm of upper-outer quadrant of right breast in female, estrogen receptor positive (HCC) 12/18/2022   Atypical chest pain 05/03/2017   Supraventricular tachycardia (HCC) 04/02/2017   GERD (gastroesophageal reflux disease) 12/22/2016   Chronic nonseasonal allergic rhinitis due to pollen 05/05/2016   B-complex deficiency 04/30/2015   Recurrent major depressive disorder (HCC) 04/30/2015   Vaginal dryness    Bladder infection    COPD (chronic obstructive pulmonary disease) (HCC)    High cholesterol    Ovarian cyst    Blood type, Rh negative    Hernia    Anxiety     REFERRING PROVIDER: Dr. Cough Ebbie   REFERRING DIAG: Rt breast cancer  THERAPY DIAG:  No diagnosis found.  Rationale for Evaluation and Treatment: Rehabilitation  ONSET DATE: 11/29/22  SUBJECTIVE:  SUBJECTIVE STATEMENT: ***  PERTINENT HISTORY:  Post Rt lumpectomy 07/12/23 with 4 negative lymph nodes removed. Has started radiation in [place]. Small axillary seroma.  Patient was diagnosed on 11/29/2022 with right grade 2 invasive lobular carcinoma breast cancer. It measures 1.1 cm and is located in the upper outer quadrant. It is ER/PR positive and HER2 negative with a Ki67 of 3%.   PATIENT GOALS:  Reassess how my recovery is going related to arm function, pain, and swelling.  PAIN:  Are you having pain? {OPRCPAIN:27236}  PRECAUTIONS: Recent Surgery, {RIGHT/LEFT:21944} UE Lymphedema risk, {Therapy precautions:24002}  RED FLAGS: {PT Red Flags:29287}   ACTIVITY LEVEL / LEISURE: ***   OBJECTIVE:   PATIENT SURVEYS:  QUICK DASH: ***  OBSERVATIONS: ***  POSTURE:  ***  LYMPHEDEMA ASSESSMENT:    UPPER EXTREMITY AROM/PROM:   A/PROM RIGHT   eval    Shoulder extension 51  Shoulder flexion 166  Shoulder abduction 164  Shoulder internal rotation 87  Shoulder external rotation 83                          (Blank rows = not tested)   A/PROM LEFT   eval  Shoulder extension 53  Shoulder flexion 153  Shoulder abduction 168  Shoulder internal rotation 76  Shoulder external rotation 70                          (Blank rows = not tested)   CERVICAL AROM: All within normal limits   UPPER EXTREMITY STRENGTH: WNL   LYMPHEDEMA ASSESSMENTS (in cm):    LANDMARK RIGHT   eval  10 cm proximal to olecranon process 26  Olecranon process 22.2  10 cm proximal to ulnar styloid process 16.8  Just proximal to ulnar styloid process 13.7  Across hand at thumb web space 16.1  At base of 2nd digit 5.3  (Blank rows = not tested)   LANDMARK LEFT   eval  10 cm proximal to olecranon process 25.8  Olecranon process 21.3  10 cm proximal to ulnar styloid process 16.4  Just proximal to ulnar styloid process 132  Across hand at thumb web space 15.4  At base of 2nd digit 5.1  (Blank rows = not tested) Surgery type/Date: *** Number of lymph nodes removed: *** Current/past treatment (chemo, radiation, hormone therapy): *** Other symptoms:  Heaviness/tightness {yes/no:20286} Pain {yes/no:20286} Pitting edema {yes/no:20286} Infections {yes/no:20286} Decreased scar mobility {yes/no:20286} Stemmer sign {yes/no:20286}  PATIENT EDUCATION:  Education details: *** Person educated: {Person educated:25204} Education method: {Education Method:25205} Education comprehension: {Education Comprehension:25206}  HOME EXERCISE PROGRAM: Reviewed previously given post op HEP. ***  ASSESSMENT:  CLINICAL IMPRESSION: ***  Pt will benefit from skilled therapeutic intervention to improve on the following deficits: Decreased knowledge of precautions, impaired UE functional use, pain, decreased ROM,  postural dysfunction.   PT treatment/interventions: ADL/Self care home management, {rehab planned interventions:25118::97110-Therapeutic exercises,97530- Therapeutic [phone]- Neuromuscular re-education,97535- Self Rjmz,02859- Manual therapy}   GOALS: Goals reviewed with patient? {yes/no:20286}  LONG TERM GOALS:  (STG=LTG)  GOALS Name Target Date  Goal status  1 Pt will demonstrate she has regained full shoulder ROM and function post operatively compared to baselines.  Baseline: *** INITIAL  2  *** INITIAL  3  *** INITIAL  4  *** INITIAL     PLAN:  PT FREQUENCY/DURATION: ***  PLAN FOR NEXT SESSION: ***   [facility]  [address], [address]  [place] [address]  [phone]  After Breast Cancer Class Video It is recommended you view the ABC class video to be educated on lymphedema risk reduction. This video lasts for about 30 minutes. It can be viewed on our website here: [email or web address]  Scar massage You can begin gentle scar massage to you incision sites. Gently place one hand on the incision and move the skin (without sliding on the skin) in various directions. Do this for a few minutes and then you can gently massage either coconut oil or vitamin E cream into the scars.  Compression garment You should continue wearing your compression bra until you feel like you no longer have swelling.  Home exercise Program Continue doing the exercises you were given until you feel like you can do them without feeling any tightness at the end.   Walking Program Studies show that 30 minutes of walking per day (fast enough to elevate your heart rate) can significantly reduce the risk of a cancer recurrence. If you can't walk due to other medical reasons, we encourage you to find another activity you could do (like a stationary bike or water exercise).  Posture After  breast cancer surgery, people frequently sit with rounded shoulders posture because it puts their incisions on slack and feels better. If you sit like this and scar tissue forms in that position, you can become very tight and have pain sitting or standing with good posture. Try to be aware of your posture and sit and stand up tall to heal properly.  Follow up PT: It is recommended you return every 3 months for the first 3 years following surgery to be assessed on the SOZO machine for an L-Dex score. This helps prevent clinically significant lymphedema in 95% of patients. These follow up screens are 10 minute appointments that you are not billed for.  Larue Saddie SAUNDERS, PT 09/30/2023, 9:19 PM

## 2023-10-01 ENCOUNTER — Ambulatory Visit: Admitting: Rehabilitation

## 2023-10-03 ENCOUNTER — Ambulatory Visit
Admission: RE | Admit: 2023-10-03 | Discharge: 2023-10-03 | Disposition: A | Discharge status: Home / Self Care | Source: Ambulatory Visit | Attending: Radiation Oncology | Admitting: Radiation Oncology

## 2023-10-03 ENCOUNTER — Other Ambulatory Visit: Payer: Self-pay

## 2023-10-03 DIAGNOSIS — Z510 Encounter for antineoplastic radiation therapy: Secondary | ICD-10-CM | POA: Diagnosis not present

## 2023-10-03 LAB — RAD ONC ARIA SESSION SUMMARY
Course Elapsed Days: 27
Plan Fractions Treated to Date: 5
Plan Prescribed Dose Per Fraction: 5.7 Gy
Plan Total Fractions Prescribed: 5
Plan Total Prescribed Dose: 28.5 Gy
Reference Point Dosage Given to Date: 28.5 Gy
Reference Point Session Dosage Given: 5.7 Gy
Session Number: 5

## 2023-10-04 ENCOUNTER — Ambulatory Visit

## 2023-10-04 NOTE — Radiation Completion Notes (Signed)
 Patient Name: Amy Lucas, Amy Lucas MRN: [id number] Date of Birth: [birth year] Referring Physician: ELEANOR LADY, M.D. Date of Service: 2023-10-04 Radiation Oncologist: Lynwood Cedar, M.D. [facility]                             RADIATION ONCOLOGY END OF TREATMENT NOTE     Diagnosis: C50.411 Malignant neoplasm of upper-outer quadrant of right female breast Staging on 2022-12-18: Malignant neoplasm of upper-outer quadrant of right breast in female, estrogen receptor positive (HCC) T=cT1c, N=cN0, M=cM0 Intent: Curative     ==========DELIVERED PLANS==========  First Treatment Date: 2023-09-06 Last Treatment Date: 2023-10-03   Plan Name: Breast_R_SBRT Site: Breast, Right Technique: 3D Mode: Photon Dose Per Fraction: 5.7 Gy Prescribed Dose (Delivered / Prescribed): 28.5 Gy / 28.5 Gy Prescribed Fxs (Delivered / Prescribed): 5 / 5     ==========ON TREATMENT VISIT DATES========== 2023-10-03     ==========UPCOMING VISITS==========       ==========APPENDIX - ON TREATMENT VISIT NOTES==========   See weekly On Treatment Notes in Epic for details in the Media tab (listed as Progress notes on the On Treatment Visit Dates listed above).

## 2023-11-27 ENCOUNTER — Inpatient Hospital Stay: Attending: Adult Health | Admitting: Adult Health

## 2023-11-27 ENCOUNTER — Telehealth: Payer: Self-pay

## 2023-11-27 NOTE — Telephone Encounter (Signed)
 Called patient due to not showing up for her 1000 app with Morna Kendall NP, patient wasn't aware she had an appointment today she was going to an app with her PCP. She asked we call her daughter to reschedule her appointment from today.

## 2023-12-10 ENCOUNTER — Inpatient Hospital Stay: Admitting: Adult Health

## 2023-12-21 ENCOUNTER — Other Ambulatory Visit: Payer: Self-pay | Admitting: Hematology and Oncology

## 2023-12-25 ENCOUNTER — Inpatient Hospital Stay

## 2023-12-25 ENCOUNTER — Encounter: Payer: Self-pay | Admitting: Genetic Counselor

## 2023-12-25 ENCOUNTER — Inpatient Hospital Stay: Attending: Adult Health | Admitting: Adult Health

## 2023-12-25 ENCOUNTER — Encounter: Payer: Self-pay | Admitting: Adult Health

## 2023-12-25 ENCOUNTER — Other Ambulatory Visit: Payer: Self-pay

## 2023-12-25 VITALS — BP 119/70 | HR 99 | Temp 97.4°F | Resp 17 | Wt 131.0 lb

## 2023-12-25 DIAGNOSIS — C50411 Malignant neoplasm of upper-outer quadrant of right female breast: Secondary | ICD-10-CM | POA: Diagnosis present

## 2023-12-25 DIAGNOSIS — Z923 Personal history of irradiation: Secondary | ICD-10-CM | POA: Insufficient documentation

## 2023-12-25 DIAGNOSIS — Z8041 Family history of malignant neoplasm of ovary: Secondary | ICD-10-CM | POA: Insufficient documentation

## 2023-12-25 DIAGNOSIS — Z79811 Long term (current) use of aromatase inhibitors: Secondary | ICD-10-CM | POA: Insufficient documentation

## 2023-12-25 DIAGNOSIS — Z803 Family history of malignant neoplasm of breast: Secondary | ICD-10-CM | POA: Diagnosis not present

## 2023-12-25 DIAGNOSIS — Z1732 Human epidermal growth factor receptor 2 negative status: Secondary | ICD-10-CM | POA: Diagnosis not present

## 2023-12-25 DIAGNOSIS — N644 Mastodynia: Secondary | ICD-10-CM | POA: Diagnosis not present

## 2023-12-25 DIAGNOSIS — Z808 Family history of malignant neoplasm of other organs or systems: Secondary | ICD-10-CM | POA: Insufficient documentation

## 2023-12-25 DIAGNOSIS — F17210 Nicotine dependence, cigarettes, uncomplicated: Secondary | ICD-10-CM | POA: Diagnosis not present

## 2023-12-25 DIAGNOSIS — Z170 Estrogen receptor positive status [ER+]: Secondary | ICD-10-CM | POA: Diagnosis not present

## 2023-12-25 DIAGNOSIS — Z1379 Encounter for other screening for genetic and chromosomal anomalies: Secondary | ICD-10-CM | POA: Insufficient documentation

## 2023-12-25 DIAGNOSIS — Z1721 Progesterone receptor positive status: Secondary | ICD-10-CM | POA: Insufficient documentation

## 2023-12-25 DIAGNOSIS — M816 Localized osteoporosis [Lequesne]: Secondary | ICD-10-CM | POA: Diagnosis not present

## 2023-12-25 NOTE — Progress Notes (Signed)
 SURVIVORSHIP VISIT:  BRIEF ONCOLOGIC HISTORY:  Oncology History  Malignant neoplasm of upper-outer quadrant of right breast in female, estrogen receptor positive (HCC)  12/18/2022 Initial Diagnosis   Screening mammogram detected right breast mass and asymmetry 11:00: 1.1 cm: Stereotactic biopsy: Grade 2 ILC ER 100%, PR 100%, Ki67 3%, HER2 negative, 12 o'clock position: 1.1 cm: Biopsy benign concordant, third biopsy also positive for grade 2 invasive lobular cancer, axilla negative   12/18/2022 Cancer Staging   Staging form: Breast, AJCC 8th Edition - Clinical stage from 12/18/2022: Stage IA (cT1c, cN0, cM0, G2, ER+, PR+, HER2-) - Signed by Lanell Donald Stagger, PA-C on 12/18/2022 Stage prefix: Initial diagnosis Method of lymph node assessment: Clinical Histologic grading system: 3 grade system   07/12/2023 Surgery   Right lumpectomy: Grade 2 invasive lobular carcinoma 3.5 cm, margins negative, 0/4 lymph nodes negative, ER 60% and 100%, PR 100%, HER2 1+ negative, Ki-67 3 to 5%   09/09/2023 - 10/03/2023 Radiation Therapy   Plan Name: Breast_R_SBRT Site: Breast, Right Technique: 3D Mode: Photon Dose Per Fraction: 5.7 Gy Prescribed Dose (Delivered / Prescribed): 28.5 Gy / 28.5 Gy Prescribed Fxs (Delivered / Prescribed): 5 / 5   08/2023 -  Anti-estrogen oral therapy   1 mg Anastrozole  x 5-7 years     INTERVAL HISTORY:  Discussed the use of AI scribe software for clinical note transcription with the patient, who gave verbal consent to proceed.  History of Present Illness Amy Lucas is a 78 year old female with breast cancer who presents with breast soreness.  She experiences intermittent breast soreness, described as a 'zing' or 'twinge', with varying intensity. The frequency of these episodes is uncertain.  Her breast cancer treatment includes lumpectomy, radiation therapy, and ongoing anastrozole  therapy without noticeable side effects.  Genetic testing results from October are  pending, with no follow-up recorded. She does not take Boniva, and her last bone density test was in 2018. Her post-surgery mammograms have been 3D diagnostic mammograms. No new issues or changes since the last visit.    REVIEW OF SYSTEMS:  Review of Systems  Constitutional:  Negative for appetite change, chills, fatigue, fever and unexpected weight change.  HENT:   Negative for hearing loss, lump/mass and trouble swallowing.   Eyes:  Negative for eye problems and icterus.  Respiratory:  Negative for chest tightness, cough and shortness of breath.   Cardiovascular:  Negative for chest pain, leg swelling and palpitations.  Gastrointestinal:  Negative for abdominal distention, abdominal pain, constipation, diarrhea, nausea and vomiting.  Endocrine: Negative for hot flashes.  Genitourinary:  Negative for difficulty urinating.   Musculoskeletal:  Negative for arthralgias.  Skin:  Negative for itching and rash.  Neurological:  Negative for dizziness, extremity weakness, headaches and numbness.  Hematological:  Negative for adenopathy. Does not bruise/bleed easily.  Psychiatric/Behavioral:  Negative for depression. The patient is not nervous/anxious.   Breast: Denies any new nodularity, masses, tenderness, nipple changes, or nipple discharge.       PAST MEDICAL/SURGICAL HISTORY:  Past Medical History:  Diagnosis Date   Anxiety    Bladder infection    Blood type, Rh negative    Breast cancer (HCC)    COPD (chronic obstructive pulmonary disease) (HCC)    GERD (gastroesophageal reflux disease)    Hernia    High cholesterol    History of chicken pox    History of measles    Hypertension    Ovarian cyst    PONV (postoperative nausea and vomiting)  Vaginal dryness    Past Surgical History:  Procedure Laterality Date   AXILLARY SENTINEL NODE BIOPSY Right 07/12/2023   Procedure: RIGHT AXILLARY SENTINEL LYMPH NODE BIOPSY;  Surgeon: Ebbie Cough, MD;  Location: [facility];  Service:  General;  Laterality: Right;   BREAST BIOPSY Right 12/12/2022   US  RT BREAST BX W LOC DEV 1ST LESION IMG BX SPEC US  GUIDE 12/12/2022 [facility]   BREAST BIOPSY Right 12/12/2022   US  RT BREAST BX W LOC DEV EA ADD LESION IMG BX SPEC US  GUIDE 12/12/2022 [facility]   BREAST BIOPSY Right 12/18/2022   MM RT BREAST BX W LOC DEV 1ST LESION IMAGE BX SPEC STEREO GUIDE 12/18/2022 [facility]   BREAST BIOPSY  07/10/2023   MM RT RADIOACTIVE SEED EA ADD LESION LOC MAMMO GUIDE 07/10/2023 [facility]   BREAST BIOPSY  07/10/2023   MM RT RADIOACTIVE SEED EA ADD LESION LOC MAMMO GUIDE 07/10/2023 [facility]   BREAST BIOPSY  07/10/2023   MM RT RADIOACTIVE SEED LOC MAMMO GUIDE 07/10/2023 [facility]   BREAST LUMPECTOMY WITH RADIOACTIVE SEED LOCALIZATION Right 07/12/2023   Procedure: RIGHT BREAST LUMPECTOMY WITH RADIOACTIVE SEED LOCALIZATION;  Surgeon: Ebbie Cough, MD;  Location: [facility] OR;  Service: General;  Laterality: Right;  RIGHT BREAST SEED GUIDED LUMPECTOMIES RIGHT AXILLARY SENTINEL NODE BIOPSY MAGTRACE INJECTION   EYE SURGERY Bilateral    cataract   HEMORRHOID SURGERY     NASAL SINUS SURGERY     TRIGGER FINGER RELEASE Right      ALLERGIES:  Allergies  Allergen Reactions   Sulfa Antibiotics Nausea And Vomiting    Other reaction(s): GI intolerance   Triamcinolone Acetonide Other (See Comments)    Seizures      CURRENT MEDICATIONS:  Outpatient Encounter Medications as of 12/25/2023  Medication Sig Note   Acetaminophen  (TYLENOL  PO) Take 2 tablets by mouth as needed (pain/headache). (Patient not taking: Reported on 08/08/2023) 07/10/2023: Pt is unsure of the dose of this medication.    albuterol (PROAIR HFA) 108 (90 Base) MCG/ACT inhaler Inhale 2 puffs into the lungs daily as needed for wheezing or shortness of breath. (Patient not taking: Reported on 08/08/2023)    ALPRAZolam (XANAX) 1 MG tablet Take 1 mg by mouth in the morning, at noon, in the evening, and  at bedtime.    amLODipine (NORVASC) 2.5 MG tablet Take 2.5 mg by mouth daily.    anastrozole  (ARIMIDEX ) 1 MG tablet TAKE 1 TABLET(1 MG) BY MOUTH DAILY    cetirizine (ZYRTEC) 10 MG tablet Take 10 mg by mouth daily as needed for allergies. (Patient not taking: Reported on 08/08/2023)    docusate sodium (COLACE) 100 MG capsule Take 200 mg by mouth daily. (Patient not taking: Reported on 08/08/2023)    Fluticasone-Salmeterol (ADVAIR) 250-50 MCG/DOSE AEPB Inhale into the lungs. (Patient not taking: Reported on 12/20/2022)    ibandronate (BONIVA) 150 MG tablet TK 1 T PO Q MONTH (Patient not taking: Reported on 07/10/2023)    ipratropium-albuterol (DUONEB) 0.5-2.5 (3) MG/3ML SOLN Inhale 3 mLs into the lungs. (Patient not taking: Reported on 07/10/2023)    meclizine  (ANTIVERT ) 25 MG tablet Take 1 tablet (25 mg total) by mouth 3 (three) times daily as needed for dizziness. (Patient not taking: Reported on 08/08/2023)    omeprazole (PRILOSEC) 40 MG capsule Take 40 mg by mouth daily.    PARoxetine (PAXIL) 30 MG tablet Take 30 mg by mouth.    polyethylene glycol powder (GLYCOLAX/MIRALAX) powder 17 g daily as needed  for mild constipation or moderate constipation. (Patient not taking: Reported on 08/08/2023)    simvastatin (ZOCOR) 20 MG tablet Take 20 mg by mouth at bedtime.    No facility-administered encounter medications on file as of 12/25/2023.     ONCOLOGIC FAMILY HISTORY:  Family History  Problem Relation Age of Onset   Heart disease Mother    Hypertension Mother    Heart failure Mother    Breast cancer Maternal Aunt        dx 30s-40s   Ovarian cancer Maternal Aunt        d. 47s   Brain cancer Maternal Aunt        d. 50s-60s   Melanoma Maternal Uncle        scalp; mets; d. >50   Breast cancer Maternal Grandmother        dx >50     SOCIAL HISTORY:  Social History   Socioeconomic History   Marital status: Divorced    Spouse name: Not on file   Number of children: 1   Years of education:  Not on file   Highest education level: 12th grade  Occupational History   Not on file  Tobacco Use   Smoking status: Every Day    Current packs/day: 1.50    Average packs/day: 1.5 packs/day for 55.8 years (83.7 ttl pk-yrs)    Types: Cigarettes    Start date: 1970   Smokeless tobacco: Never  Vaping Use   Vaping status: Former  Substance and Sexual Activity   Alcohol use: Not Currently   Drug use: No   Sexual activity: Yes    Birth control/protection: Post-menopausal  Other Topics Concern   Not on file  Social History Narrative   Not on file   Social Drivers of Health   Financial Resource Strain: Not on file  Food Insecurity: No Food Insecurity (07/30/2023)   Hunger Vital Sign    Worried About Running Out of Food in the Last Year: Never true    Ran Out of Food in the Last Year: Never true  Transportation Needs: No Transportation Needs (07/30/2023)   PRAPARE - Administrator, Civil Service (Medical): No    Lack of Transportation (Non-Medical): No  Physical Activity: Not on file  Stress: Not on file  Social Connections: Not on file  Intimate Partner Violence: Not At Risk (07/30/2023)   Humiliation, Afraid, Rape, and Kick questionnaire    Fear of Current or Ex-Partner: No    Emotionally Abused: No    Physically Abused: No    Sexually Abused: No     OBSERVATIONS/OBJECTIVE:  BP 119/70 (BP Location: Left Arm, Patient Position: Sitting)   Pulse 99   Temp (!) 97.4 F (36.3 C) (Temporal)   Resp 17   Wt 131 lb (59.4 kg)   SpO2 100%   BMI 23.96 kg/m  GENERAL: Patient is a well appearing female in no acute distress HEENT:  Sclerae anicteric.  Oropharynx clear and moist. No ulcerations or evidence of oropharyngeal candidiasis. Neck is supple.  NODES:  No cervical, supraclavicular, or axillary lymphadenopathy palpated.  BREAST EXAM:  Deferred. LUNGS:  Clear to auscultation bilaterally.  No wheezes or rhonchi. HEART:  Regular rate and rhythm. No murmur  appreciated. ABDOMEN:  Soft, nontender.  Positive, normoactive bowel sounds. No organomegaly palpated. MSK:  No focal spinal tenderness to palpation. Full range of motion bilaterally in the upper extremities. EXTREMITIES:  No peripheral edema.   SKIN:  Clear with no obvious  rashes or skin changes. No nail dyscrasia. NEURO:  Nonfocal. Well oriented.  Appropriate affect.   LABORATORY DATA:  None for this visit.  DIAGNOSTIC IMAGING:  None for this visit.      ASSESSMENT AND PLAN:  Ms.. Lucas is a pleasant 78 y.o. 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  She presents to the Survivorship Clinic for our initial meeting and routine follow-up post-completion of treatment for breast cancer.    1. Stage IA right breast cancer:  Ms. Rosensteel is continuing to recover from definitive treatment for breast cancer. She will follow-up with her medical oncologist, Dr.  Gudena in 04/2024 with history and physical exam per surveillance protocol.  She will continue her anti-estrogen therapy with Anastrozole . Thus far, she is tolerating the Anastrozole  well, with minimal side effects. Her mammogram is due 03/2024; orders placed today.  She would like to undergo guardant reveal testing; orders placed for every 6 month draws here.  Today, a comprehensive survivorship care plan and treatment summary was reviewed with the patient today detailing her breast cancer diagnosis, treatment course, potential late/long-term effects of treatment, appropriate follow-up care with recommendations for the future, and patient education resources.  A copy of this summary, along with a letter will be sent to the patient's primary care provider via mail/fax/In Basket message after today's visit.    2. Bone health:  Given Ms. Helvie's age/history of breast cancer  and her current treatment regimen including anti-estrogen therapy with Anastrozole , she is at risk for bone demineralization.  Her last DEXA scan was in 2018, orders placed for repeat testing.  She was given education on specific activities to promote bone health.  3. Cancer screening:  Due to Ms. Dumler's history and her age, she should receive screening for skin cancers, colon cancer, and gynecologic cancers.  The information and recommendations are listed on the patient's comprehensive care plan/treatment summary and were reviewed in detail with the patient.    4. Health maintenance and wellness promotion: Ms. Seib was encouraged to consume 5-7 servings of fruits and vegetables per day. We reviewed the Nutrition Rainbow handout.  She was also encouraged to engage in moderate to vigorous exercise for 30 minutes per day most days of the week.  She was instructed to limit her alcohol consumption and continue to abstain from tobacco use.     5. Support services/counseling: It is not uncommon for this period of the patient's cancer care trajectory to be one of many emotions and stressors.   She was given information regarding our available services and encouraged to contact me with any questions or for help enrolling in any of our support group/programs.    Follow up instructions:    -Return to cancer center in 04/2024 for f/u with Dr. Odean  - See Dr. Ebbie in 07/2024  -Mammogram due in 03/2024 -DEXA testing is pending -She is welcome to return back to the Survivorship Clinic at any time; no additional follow-up needed at this time.  -Consider referral back to survivorship as a long-term survivor for continued surveillance  The patient was provided an opportunity to ask questions and all were answered. The patient agreed with the plan and demonstrated an understanding of the instructions.   Total encounter time:45 minutes*in face-to-face visit time, chart review, lab review, care  coordination, order entry, and documentation of the encounter time.    Morna  Crawford, NP 12/25/23 11:15 AM Medical Oncology and Hematology [facility] [address] [place], [place] [address] Tel. [phone]    Fax. [phone]  *Total Encounter Time as defined by the Centers for Medicare and Medicaid Services includes, in addition to the face-to-face time of a patient visit (documented in the note above) non-face-to-face time: obtaining and reviewing outside history, ordering and reviewing medications, tests or procedures, care coordination (communications with other health care professionals or caregivers) and documentation in the medical record.

## 2023-12-26 ENCOUNTER — Telehealth: Payer: Self-pay | Admitting: Genetic Counselor

## 2023-12-26 ENCOUNTER — Ambulatory Visit: Payer: Self-pay | Admitting: Genetic Counselor

## 2023-12-26 ENCOUNTER — Telehealth: Payer: Self-pay | Admitting: Hematology and Oncology

## 2023-12-26 DIAGNOSIS — C50411 Malignant neoplasm of upper-outer quadrant of right female breast: Secondary | ICD-10-CM

## 2023-12-26 DIAGNOSIS — Z1379 Encounter for other screening for genetic and chromosomal anomalies: Secondary | ICD-10-CM

## 2023-12-26 NOTE — Progress Notes (Signed)
 HPI:  Amy Lucas was previously seen in the [facility] due to a personal and family history of cancer and concerns regarding a hereditary predisposition to cancer. Please refer to our prior cancer genetics clinic note for more information regarding our discussion, assessment and recommendations, at the time. Amy Lucas recent genetic test results were disclosed to her, as were recommendations warranted by these results. These results and recommendations are discussed in more detail below.  CANCER HISTORY:  Oncology History  Malignant neoplasm of upper-outer quadrant of right breast in female, estrogen receptor positive (HCC)  12/18/2022 Initial Diagnosis   Screening mammogram detected right breast mass and asymmetry 11:00: 1.1 cm: Stereotactic biopsy: Grade 2 ILC ER 100%, PR 100%, Ki67 3%, HER2 negative, 12 o'clock position: 1.1 cm: Biopsy benign concordant, third biopsy also positive for grade 2 invasive lobular cancer, axilla negative   12/18/2022 Cancer Staging   Staging form: Breast, AJCC 8th Edition - Clinical stage from 12/18/2022: Stage IA (cT1c, cN0, cM0, G2, ER+, PR+, HER2-) - Signed by Lanell Donald Stagger, PA-C on 12/18/2022 Stage prefix: Initial diagnosis Method of lymph node assessment: Clinical Histologic grading system: 3 grade system   01/17/2023 Genetic Testing   Negative genetic testing. Report date is 01/17/2023  The CancerNext-Expanded gene panel offered by [facility] and includes sequencing, rearrangement, and RNA analysis for the following 77 genes: AIP, ALK, APC, ATM, BAP1, BARD1, BMPR1A, BRCA1, BRCA2, BRIP1, CDC73, CDH1, CDK4, CDKN1B, CDKN2A, CEBPA, CHEK2, CTNNA1, DDX41, DICER1, ETV6, FH, FLCN, GATA2, LZTR1, MAX, MBD4, MEN1, MET, MLH1, MSH2, MSH3, MSH6, MUTYH, NF1, NF2, NTHL1, PALB2, PHOX2B, PMS2, POT1, PRKAR1A, PTCH1, PTEN, RAD51C, RAD51D, RB1, RET, RPS20, RUNX1, SDHA, SDHAF2, SDHB, SDHC, SDHD, SMAD4, SMARCA4, SMARCB1, SMARCE1, STK11, SUFU,  TMEM127, TP53, TSC1, TSC2, VHL, and WT1 (sequencing and deletion/duplication); AXIN2, CTNNA1, DDX41, EGFR, HOXB13, KIT, MBD4, MITF, MSH3, PDGFRA, POLD1 and POLE (sequencing only); EPCAM and GREM1 (deletion/duplication only). RNA data is routinely analyzed for use in variant interpretation for all genes.   07/12/2023 Surgery   Right lumpectomy: Grade 2 invasive lobular carcinoma 3.5 cm, margins negative, 0/4 lymph nodes negative, ER 60% and 100%, PR 100%, HER2 1+ negative, Ki-67 3 to 5%   07/12/2023 Cancer Staging   Staging form: Breast, AJCC 8th Edition - Pathologic stage from 07/12/2023: Stage IA (pT2, pN0, cM0, G2, ER+, PR+, HER2-) - Signed by Crawford Morna Pickle, NP on 12/25/2023 Stage prefix: Initial diagnosis Histologic grading system: 3 grade system   09/09/2023 - 10/03/2023 Radiation Therapy   Plan Name: Breast_R_SBRT Site: Breast, Right Technique: 3D Mode: Photon Dose Per Fraction: 5.7 Gy Prescribed Dose (Delivered / Prescribed): 28.5 Gy / 28.5 Gy Prescribed Fxs (Delivered / Prescribed): 5 / 5   08/2023 -  Anti-estrogen oral therapy   1 mg Anastrozole  x 5-7 years     FAMILY HISTORY:  We obtained a detailed, 4-generation family history.  Significant diagnoses are listed below: Family History  Problem Relation Age of Onset   Heart disease Mother    Hypertension Mother    Heart failure Mother    Breast cancer Maternal Aunt        dx 30s-40s   Ovarian cancer Maternal Aunt        d. 85s   Brain cancer Maternal Aunt        d. 50s-60s   Melanoma Maternal Uncle        scalp; mets; d. >50   Breast cancer Maternal Grandmother        dx >  40       Amy Lucas is unaware of previous family history of genetic testing for hereditary cancer risks. She does not have any information about her father/paternal relatives. There is no reported Ashkenazi Jewish ancestry.  GENETIC TEST RESULTS: Genetic testing reported out on January 17, 2023 through the CancerNext-Expanded+RNAinsight  cancer panel found no pathogenic mutations. The CancerNext-Expanded gene panel offered by [facility] and includes sequencing, rearrangement, and RNA analysis for the following 77 genes: AIP, ALK, APC, ATM, BAP1, BARD1, BMPR1A, BRCA1, BRCA2, BRIP1, CDC73, CDH1, CDK4, CDKN1B, CDKN2A, CEBPA, CHEK2, CTNNA1, DDX41, DICER1, ETV6, FH, FLCN, GATA2, LZTR1, MAX, MBD4, MEN1, MET, MLH1, MSH2, MSH3, MSH6, MUTYH, NF1, NF2, NTHL1, PALB2, PHOX2B, PMS2, POT1, PRKAR1A, PTCH1, PTEN, RAD51C, RAD51D, RB1, RET, RPS20, RUNX1, SDHA, SDHAF2, SDHB, SDHC, SDHD, SMAD4, SMARCA4, SMARCB1, SMARCE1, STK11, SUFU, TMEM127, TP53, TSC1, TSC2, VHL, and WT1 (sequencing and deletion/duplication); AXIN2, CTNNA1, DDX41, EGFR, HOXB13, KIT, MBD4, MITF, MSH3, PDGFRA, POLD1 and POLE (sequencing only); EPCAM and GREM1 (deletion/duplication only). RNA data is routinely analyzed for use in variant interpretation for all genes. The test report has been scanned into EPIC and is located under the Molecular Pathology section of the Results Review tab.  A portion of the result report is included below for reference.     We discussed with Amy Lucas that because current genetic testing is not perfect, it is possible there may be a gene mutation in one of these genes that current testing cannot detect, but that chance is small.  We also discussed, that there could be another gene that has not yet been discovered, or that we have not yet tested, that is responsible for the cancer diagnoses in the family. It is also possible there is a hereditary cause for the cancer in the family that Amy Lucas did not inherit and therefore was not identified in her testing.  Therefore, it is important to remain in touch with cancer genetics in the future so that we can continue to offer Amy Lucas the most up to date genetic testing.   ADDITIONAL GENETIC TESTING: We discussed with Amy Lucas that her genetic testing was fairly extensive.  If there are genes identified to  increase cancer risk that can be analyzed in the future, we would be happy to discuss and coordinate this testing at that time.    CANCER SCREENING RECOMMENDATIONS: Amy Lucas test result is considered negative (normal).  This means that we have not identified a hereditary cause for her personal and family history of cancer at this time. Most cancers happen by chance and this negative test suggests that her personal and family history of cancer may fall into this category.    Possible reasons for Amy Lucas's negative genetic test include:  1. There may be a gene mutation in one of these genes that current testing methods cannot detect but that chance is small.  2. There could be another gene that has not yet been discovered, or that we have not yet tested, that is responsible for the cancer diagnoses in the family.  3.  There may be no hereditary risk for cancer in the family. The cancers in Amy Lucas and/or her family may be sporadic/familial or due to other genetic and environmental factors. 4. It is also possible there is a hereditary cause for the cancer in the family that Amy Lucas did not inherit.  Therefore, it is recommended she continue to follow the cancer management and screening guidelines provided by her oncology and  primary healthcare provider. An individual's cancer risk and medical management are not determined by genetic test results alone. Overall cancer risk assessment incorporates additional factors, including personal medical history, family history, and any available genetic information that may result in a personalized plan for cancer prevention and surveillance  RECOMMENDATIONS FOR FAMILY MEMBERS:   Since she did not inherit a identifiable mutation in a cancer predisposition gene included on this panel, her children could not have inherited a known mutation from her in one of these genes. Individuals in this family might be at some increased risk of developing cancer,  over the general population risk, simply due to the family history of cancer.  We recommended women in this family have a yearly mammogram beginning at age 83, or 6 years younger than the earliest onset of cancer, an annual clinical breast exam, and perform monthly breast self-exams. Women in this family should also have a gynecological exam as recommended by their primary provider. All family members should be referred for colonoscopy starting at age 87, or 12 years younger than the earliest onset of cancer.  FOLLOW-UP: Lastly, we discussed with Amy Lucas that cancer genetics is a rapidly advancing field and it is possible that new genetic tests will be appropriate for her and/or her family members in the future. We encouraged her to remain in contact with cancer genetics on an annual basis so we can update her personal and family histories and let her know of advances in cancer genetics that may benefit this family.   Our contact number was provided. Amy Lucas questions were answered to her satisfaction, and she knows she is welcome to call us  at anytime with additional questions or concerns.   Darice Monte, MS, [facility] Licensed, Certified Genetic Counselor [email or web address]

## 2023-12-26 NOTE — Telephone Encounter (Signed)
 spoke with pt to schedule f/u appts and she stated she will have her daughter to give us  a call back to schedule since she is her transportation.

## 2023-12-26 NOTE — Telephone Encounter (Signed)
 I contacted  Amy Lucas to discuss her genetic testing results. No pathogenic variants were identified in the 77 genes analyzed. Discussed that we do not know why she has breast cancer or why there is cancer in the family. It could be due to a different gene that we are not testing, or maybe our current technology may not be able to pick something up.  It will be important for her to keep in contact with genetics to keep up with whether additional testing may be needed.Detailed clinic note to follow.   The test report will be scanned into EPIC and will be located under the Molecular Pathology section of the Results Review tab.  A portion of the result report is included below for reference.

## 2024-01-04 ENCOUNTER — Encounter: Payer: Self-pay | Admitting: Adult Health

## 2024-01-15 ENCOUNTER — Other Ambulatory Visit (HOSPITAL_BASED_OUTPATIENT_CLINIC_OR_DEPARTMENT_OTHER): Admitting: Radiology

## 2024-01-15 LAB — GUARDANT REVEAL

## 2024-01-17 ENCOUNTER — Ambulatory Visit (INDEPENDENT_AMBULATORY_CARE_PROVIDER_SITE_OTHER)
Admission: RE | Admit: 2024-01-17 | Discharge: 2024-01-17 | Disposition: A | Discharge status: Home / Self Care | Source: Ambulatory Visit | Attending: Adult Health | Admitting: Adult Health

## 2024-01-17 DIAGNOSIS — Z170 Estrogen receptor positive status [ER+]: Secondary | ICD-10-CM

## 2024-01-17 DIAGNOSIS — M816 Localized osteoporosis [Lequesne]: Secondary | ICD-10-CM

## 2024-01-17 DIAGNOSIS — M810 Age-related osteoporosis without current pathological fracture: Secondary | ICD-10-CM | POA: Diagnosis not present

## 2024-01-22 ENCOUNTER — Ambulatory Visit: Payer: Self-pay

## 2024-01-22 NOTE — Telephone Encounter (Signed)
 Called mobile # and got daughter. Given below message. She verbalized understanding. Amy Lucas has not taken Boniva in several years and would like to take something for bone health.  Scheduled telephone visit in 11/13, daughter is aware of appt time/date and will share with her Mom.

## 2024-01-22 NOTE — Telephone Encounter (Signed)
-----   Message from Morna JAYSON Kendall sent at 01/22/2024 11:18 AM EST ----- Bone density testing shows osteoporosis.  Will you ask her if she is still taking Boniva?  If so, who prescribes it?  If it is her PCP then would forward results to PCP.  If she is not taking Boniva,  then would like to set up a phone visit to discuss bone health.  Thanks, LC ----- Message ----- From: Interface, Rad Results In Sent: 01/21/2024   2:16 PM EST To: Morna Dalton Kendall, NP

## 2024-01-23 NOTE — Progress Notes (Signed)
  Cancer Center Cancer Follow up:    Benson Eleanor Rung, NP [address] [place] [address]   DIAGNOSIS: Cancer Staging  Malignant neoplasm of upper-outer quadrant of right breast in female, estrogen receptor positive (HCC) Staging form: Breast, AJCC 8th Edition - Clinical stage from 12/18/2022: Stage IA (cT1c, cN0, cM0, G2, ER+, PR+, HER2-) - Signed by Lanell Donald Stagger, PA-C on 12/18/2022 Stage prefix: Initial diagnosis Method of lymph node assessment: Clinical Histologic grading system: 3 grade system - Pathologic stage from 07/12/2023: Stage IA (pT2, pN0, cM0, G2, ER+, PR+, HER2-) - Signed by Crawford Morna Pickle, NP on 12/25/2023 Stage prefix: Initial diagnosis Histologic grading system: 3 grade system  I connected with Orie Amabile on 01/23/24 at 10:40 AM EST by telephone and verified that I am speaking with the correct person using two identifiers. I discussed the limitations, risks, security and privacy concerns of performing an evaluation and management service by telephone and the availability of in person appointments. I also discussed with the patient that there may be a patient responsible charge related to this service. The patient expressed understanding and agreed to proceed.   Patient location: Home Provider location: [facility] office Others participating in call: None   SUMMARY OF ONCOLOGIC HISTORY: Oncology History  Malignant neoplasm of upper-outer quadrant of right breast in female, estrogen receptor positive (HCC)  12/18/2022 Initial Diagnosis   Screening mammogram detected right breast mass and asymmetry 11:00: 1.1 cm: Stereotactic biopsy: Grade 2 ILC ER 100%, PR 100%, Ki67 3%, HER2 negative, 12 o'clock position: 1.1 cm: Biopsy benign concordant, third biopsy also positive for grade 2 invasive lobular cancer, axilla negative   12/18/2022 Cancer Staging   Staging form: Breast, AJCC 8th Edition - Clinical stage from 12/18/2022: Stage IA  (cT1c, cN0, cM0, G2, ER+, PR+, HER2-) - Signed by Lanell Donald Stagger, PA-C on 12/18/2022 Stage prefix: Initial diagnosis Method of lymph node assessment: Clinical Histologic grading system: 3 grade system   01/17/2023 Genetic Testing   Negative genetic testing. Report date is 01/17/2023  The CancerNext-Expanded gene panel offered by [facility] and includes sequencing, rearrangement, and RNA analysis for the following 77 genes: AIP, ALK, APC, ATM, BAP1, BARD1, BMPR1A, BRCA1, BRCA2, BRIP1, CDC73, CDH1, CDK4, CDKN1B, CDKN2A, CEBPA, CHEK2, CTNNA1, DDX41, DICER1, ETV6, FH, FLCN, GATA2, LZTR1, MAX, MBD4, MEN1, MET, MLH1, MSH2, MSH3, MSH6, MUTYH, NF1, NF2, NTHL1, PALB2, PHOX2B, PMS2, POT1, PRKAR1A, PTCH1, PTEN, RAD51C, RAD51D, RB1, RET, RPS20, RUNX1, SDHA, SDHAF2, SDHB, SDHC, SDHD, SMAD4, SMARCA4, SMARCB1, SMARCE1, STK11, SUFU, TMEM127, TP53, TSC1, TSC2, VHL, and WT1 (sequencing and deletion/duplication); AXIN2, CTNNA1, DDX41, EGFR, HOXB13, KIT, MBD4, MITF, MSH3, PDGFRA, POLD1 and POLE (sequencing only); EPCAM and GREM1 (deletion/duplication only). RNA data is routinely analyzed for use in variant interpretation for all genes.   07/12/2023 Surgery   Right lumpectomy: Grade 2 invasive lobular carcinoma 3.5 cm, margins negative, 0/4 lymph nodes negative, ER 60% and 100%, PR 100%, HER2 1+ negative, Ki-67 3 to 5%   07/12/2023 Cancer Staging   Staging form: Breast, AJCC 8th Edition - Pathologic stage from 07/12/2023: Stage IA (pT2, pN0, cM0, G2, ER+, PR+, HER2-) - Signed by Crawford Morna Pickle, NP on 12/25/2023 Stage prefix: Initial diagnosis Histologic grading system: 3 grade system   09/09/2023 - 10/03/2023 Radiation Therapy   Plan Name: Breast_R_SBRT Site: Breast, Right Technique: 3D Mode: Photon Dose Per Fraction: 5.7 Gy Prescribed Dose (Delivered / Prescribed): 28.5 Gy / 28.5 Gy Prescribed Fxs (Delivered / Prescribed): 5 / 5  08/2023 -  Anti-estrogen oral therapy   1 mg Anastrozole  x 5-7  years     CURRENT THERAPY: Anastrozole   INTERVAL HISTORY:  Discussed the use of AI scribe software for clinical note transcription with the patient, who gave verbal consent to proceed.  History of Present Illness Charisse Wendell is a 78 year old female with osteoporosis who presents for follow-up and evaluation while taking anastrozole .  She is aware of osteoporosis in the spine and left hip from a recent bone density test. She expresses concern about the potential for fractures. She previously took Boniva but discontinued it due to the dosing schedule and difficulty with side effects and compliance.  She is currently on anastrozole  for breast cancer, taken nightly at 10 PM. She feels positive about her health status.     Patient Active Problem List   Diagnosis Date Noted   Genetic testing 12/25/2023   Malignant neoplasm of upper-outer quadrant of right breast in female, estrogen receptor positive (HCC) 12/18/2022   Atypical chest pain 05/03/2017   Supraventricular tachycardia 04/02/2017   GERD (gastroesophageal reflux disease) 12/22/2016   Chronic nonseasonal allergic rhinitis due to pollen 05/05/2016   B-complex deficiency 04/30/2015   Recurrent major depressive disorder 04/30/2015   Vaginal dryness    Bladder infection    COPD (chronic obstructive pulmonary disease) (HCC)    High cholesterol    Ovarian cyst    Blood type, Rh negative    Hernia    Anxiety     is allergic to sulfa antibiotics and triamcinolone acetonide.  MEDICAL HISTORY: Past Medical History:  Diagnosis Date   Anxiety    Bladder infection    Blood type, Rh negative    Breast cancer (HCC)    COPD (chronic obstructive pulmonary disease) (HCC)    GERD (gastroesophageal reflux disease)    Hernia    High cholesterol    History of chicken pox    History of measles    Hypertension    Ovarian cyst    PONV (postoperative nausea and vomiting)    Vaginal dryness     SURGICAL HISTORY: Past Surgical  History:  Procedure Laterality Date   AXILLARY SENTINEL NODE BIOPSY Right 07/12/2023   Procedure: RIGHT AXILLARY SENTINEL LYMPH NODE BIOPSY;  Surgeon: Ebbie Cough, MD;  Location: [facility] OR;  Service: General;  Laterality: Right;   BREAST BIOPSY Right 12/12/2022   US  RT BREAST BX W LOC DEV 1ST LESION IMG BX SPEC US  GUIDE 12/12/2022 [facility]   BREAST BIOPSY Right 12/12/2022   US  RT BREAST BX W LOC DEV EA ADD LESION IMG BX SPEC US  GUIDE 12/12/2022 [facility]   BREAST BIOPSY Right 12/18/2022   MM RT BREAST BX W LOC DEV 1ST LESION IMAGE BX SPEC STEREO GUIDE 12/18/2022 [facility]   BREAST BIOPSY  07/10/2023   MM RT RADIOACTIVE SEED EA ADD LESION LOC MAMMO GUIDE 07/10/2023 [facility]   BREAST BIOPSY  07/10/2023   MM RT RADIOACTIVE SEED EA ADD LESION LOC MAMMO GUIDE 07/10/2023 [facility]   BREAST BIOPSY  07/10/2023   MM RT RADIOACTIVE SEED LOC MAMMO GUIDE 07/10/2023 [facility]   BREAST LUMPECTOMY WITH RADIOACTIVE SEED LOCALIZATION Right 07/12/2023   Procedure: RIGHT BREAST LUMPECTOMY WITH RADIOACTIVE SEED LOCALIZATION;  Surgeon: Ebbie Cough, MD;  Location: [facility] OR;  Service: General;  Laterality: Right;  RIGHT BREAST SEED GUIDED LUMPECTOMIES RIGHT AXILLARY SENTINEL NODE BIOPSY MAGTRACE INJECTION   EYE SURGERY Bilateral    cataract   HEMORRHOID SURGERY  NASAL SINUS SURGERY     TRIGGER FINGER RELEASE Right     SOCIAL HISTORY: Social History   Socioeconomic History   Marital status: Divorced    Spouse name: Not on file   Number of children: 1   Years of education: Not on file   Highest education level: 12th grade  Occupational History   Not on file  Tobacco Use   Smoking status: Every Day    Current packs/day: 1.50    Average packs/day: 1.5 packs/day for 55.9 years (83.8 ttl pk-yrs)    Types: Cigarettes    Start date: 1970   Smokeless tobacco: Never  Vaping Use   Vaping status: Former  Substance and Sexual Activity   Alcohol use:  Not Currently   Drug use: No   Sexual activity: Yes    Birth control/protection: Post-menopausal  Other Topics Concern   Not on file  Social History Narrative   Not on file   Social Drivers of Health   Financial Resource Strain: Not on file  Food Insecurity: No Food Insecurity (07/30/2023)   Hunger Vital Sign    Worried About Running Out of Food in the Last Year: Never true    Ran Out of Food in the Last Year: Never true  Transportation Needs: No Transportation Needs (07/30/2023)   PRAPARE - Administrator, Civil Service (Medical): No    Lack of Transportation (Non-Medical): No  Physical Activity: Not on file  Stress: Not on file  Social Connections: Not on file  Intimate Partner Violence: Not At Risk (07/30/2023)   Humiliation, Afraid, Rape, and Kick questionnaire    Fear of Current or Ex-Partner: No    Emotionally Abused: No    Physically Abused: No    Sexually Abused: No    FAMILY HISTORY: Family History  Problem Relation Age of Onset   Heart disease Mother    Hypertension Mother    Heart failure Mother    Breast cancer Maternal Aunt        dx 30s-40s   Ovarian cancer Maternal Aunt        d. 49s   Brain cancer Maternal Aunt        d. 50s-60s   Melanoma Maternal Uncle        scalp; mets; d. >50   Breast cancer Maternal Grandmother        dx >50    Review of Systems  Constitutional:  Negative for appetite change, chills, fatigue, fever and unexpected weight change.  HENT:   Negative for hearing loss, lump/mass and trouble swallowing.   Eyes:  Negative for eye problems and icterus.  Respiratory:  Negative for chest tightness, cough and shortness of breath.   Cardiovascular:  Negative for chest pain, leg swelling and palpitations.  Gastrointestinal:  Negative for abdominal distention, abdominal pain, constipation, diarrhea, nausea and vomiting.  Endocrine: Negative for hot flashes.  Genitourinary:  Negative for difficulty urinating.   Musculoskeletal:   Negative for arthralgias.  Skin:  Negative for itching and rash.  Neurological:  Negative for dizziness, extremity weakness, headaches and numbness.  Hematological:  Negative for adenopathy. Does not bruise/bleed easily.  Psychiatric/Behavioral:  Negative for depression. The patient is not nervous/anxious.       PHYSICAL EXAMINATION Patient sounds well, in no apparent distress, mood and behavior are normal, speech is normal.  LABORATORY DATA:  CBC    Component Value Date/Time   WBC 7.9 07/10/2023 1155   RBC 4.66 07/10/2023 1155  HGB 14.5 07/10/2023 1155   HGB 14.5 12/20/2022 1212   HCT 43.7 07/10/2023 1155   PLT 207 07/10/2023 1155   PLT 191 12/20/2022 1212   MCV 93.8 07/10/2023 1155   MCH 31.1 07/10/2023 1155   MCHC 33.2 07/10/2023 1155   RDW 12.7 07/10/2023 1155   LYMPHSABS 2.4 12/20/2022 1212   MONOABS 0.6 12/20/2022 1212   EOSABS 0.1 12/20/2022 1212   BASOSABS 0.0 12/20/2022 1212    CMP     Component Value Date/Time   NA 140 07/10/2023 1155   K 4.1 07/10/2023 1155   CL 104 07/10/2023 1155   CO2 29 07/10/2023 1155   GLUCOSE 94 07/10/2023 1155   BUN 9 07/10/2023 1155   CREATININE 0.80 07/10/2023 1155   CREATININE 0.82 12/20/2022 1212   CALCIUM 9.2 07/10/2023 1155   PROT 7.2 12/20/2022 1212   ALBUMIN 4.1 12/20/2022 1212   AST 12 (L) 12/20/2022 1212   ALT 7 12/20/2022 1212   ALKPHOS 50 12/20/2022 1212   BILITOT 0.3 12/20/2022 1212   GFRNONAA >60 07/10/2023 1155   GFRNONAA >60 12/20/2022 1212   GFRAA >60 09/21/2018 1943     ASSESSMENT and THERAPY PLAN:    Assessment and Plan Assessment & Plan Osteoporosis of spine and left hip Osteoporosis confirmed via bone density testing. Anastrozole  may worsen osteoporosis. Zometa recommended for fracture prevention and potential reduction of breast cancer metastasis to bones. - Administer Zometa IV every six months for first two years, then annually. - Schedule first Zometa infusion in January with Dr. Odean f/u  as her f/u with him is due around this time.   - Perform labs prior to Zometa infusion. - Recheck bone density after initial Zometa treatment. - Reviewed risks and benefits, and gave patient information about Zometa in her after visit summary.    History of malignant neoplasm of right breast Currently on anastrozole  therapy. - Continue anastrozole  therapy.    The patient was provided an opportunity to ask questions and all were answered. The patient agreed with the plan and demonstrated an understanding of the instructions.   The patient was advised to call back or seek an in-person evaluation if the symptoms worsen or if the condition fails to improve as anticipated.   I provided 15 minutes of non face-to-face telephone visit time during this encounter, and > 50% was spent counseling as documented under my assessment & plan.   Morna Kendall, NP 01/23/24 12:16 PM Medical Oncology and Hematology [facility] [address] [place], [place] [address] Tel. [phone]    Fax. [phone]  *Total Encounter Time as defined by the Centers for Medicare and Medicaid Services includes, in addition to the face-to-face time of a patient visit (documented in the note above) non-face-to-face time: obtaining and reviewing outside history, ordering and reviewing medications, tests or procedures, care coordination (communications with other health care professionals or caregivers) and documentation in the medical record.

## 2024-01-24 ENCOUNTER — Inpatient Hospital Stay: Attending: Adult Health | Admitting: Adult Health

## 2024-01-24 DIAGNOSIS — M816 Localized osteoporosis [Lequesne]: Secondary | ICD-10-CM

## 2024-01-24 DIAGNOSIS — Z170 Estrogen receptor positive status [ER+]: Secondary | ICD-10-CM

## 2024-01-24 DIAGNOSIS — C50411 Malignant neoplasm of upper-outer quadrant of right female breast: Secondary | ICD-10-CM

## 2024-01-24 DIAGNOSIS — M810 Age-related osteoporosis without current pathological fracture: Secondary | ICD-10-CM | POA: Insufficient documentation

## 2024-01-24 NOTE — Patient Instructions (Signed)
 I recommend that you receive Zometa intravenously (below) for osteoporosis.  It can also help the cancer from going to your bones.  We will schedule this with your follow up with Dr. Gudena that is due in a couple of months.    Zoledronic Acid Injection (Cancer) What is this medication? ZOLEDRONIC ACID (ZOE le dron ik AS id) treats high calcium levels in the blood caused by cancer. It may also be used with chemotherapy to treat weakened bones caused by cancer. It works by slowing down the release of calcium from bones. This lowers calcium levels in your blood. It also makes your bones stronger and less likely to break (fracture). It belongs to a group of medications called bisphosphonates. This medicine may be used for other purposes; ask your health care provider or pharmacist if you have questions. COMMON BRAND NAME(S): Zometa, Zometa Powder What should I tell my care team before I take this medication? They need to know if you have any of these conditions: Dehydration Dental disease Kidney disease Liver disease Low levels of calcium in the blood Lung or breathing disease, such as asthma Receiving steroids, such as dexamethasone  or prednisone An unusual or allergic reaction to zoledronic acid, other medications, foods, dyes, or preservatives Pregnant or trying to get pregnant Breast-feeding How should I use this medication? This medication is injected into a vein. It is given by your care team in a hospital or clinic setting. Talk to your care team about the use of this medication in children. Special care may be needed. Overdosage: If you think you have taken too much of this medicine contact a poison control center or emergency room at once. NOTE: This medicine is only for you. Do not share this medicine with others. What if I miss a dose? Keep appointments for follow-up doses. It is important not to miss your dose. Call your care team if you are unable to keep an appointment. What may  interact with this medication? Certain antibiotics given by injection Diuretics, such as bumetanide, furosemide NSAIDs, medications for pain and inflammation, such as ibuprofen or naproxen Teriparatide Thalidomide This list may not describe all possible interactions. Give your health care provider a list of all the medicines, herbs, non-prescription drugs, or dietary supplements you use. Also tell them if you smoke, drink alcohol, or use illegal drugs. Some items may interact with your medicine. What should I watch for while using this medication? Visit your care team for regular checks on your progress. It may be some time before you see the benefit from this medication. Some people who take this medication have severe bone, joint, or muscle pain. This medication may also increase your risk for jaw problems or a broken thigh bone. Tell your care team right away if you have severe pain in your jaw, bones, joints, or muscles. Tell you care team if you have any pain that does not go away or that gets worse. Tell your dentist and dental surgeon that you are taking this medication. You should not have major dental surgery while on this medication. See your dentist to have a dental exam and fix any dental problems before starting this medication. Take good care of your teeth while on this medication. Make sure you see your dentist for regular follow-up appointments. You should make sure you get enough calcium and vitamin D while you are taking this medication. Discuss the foods you eat and the vitamins you take with your care team. Check with your care team  if you have severe diarrhea, nausea, and vomiting, or if you sweat a lot. The loss of too much body fluid may make it dangerous for you to take this medication. You may need bloodwork while taking this medication. Talk to your care team if you wish to become pregnant or think you might be pregnant. This medication can cause serious birth defects. What  side effects may I notice from receiving this medication? Side effects that you should report to your care team as soon as possible: Allergic reactions--skin rash, itching, hives, swelling of the face, lips, tongue, or throat Kidney injury--decrease in the amount of urine, swelling of the ankles, hands, or feet Low calcium level--muscle pain or cramps, confusion, tingling, or numbness in the hands or feet Osteonecrosis of the jaw--pain, swelling, or redness in the mouth, numbness of the jaw, poor healing after dental work, unusual discharge from the mouth, visible bones in the mouth Severe bone, joint, or muscle pain Side effects that usually do not require medical attention (report to your care team if they continue or are bothersome): Constipation Fatigue Fever Loss of appetite Nausea Stomach pain This list may not describe all possible side effects. Call your doctor for medical advice about side effects. You may report side effects to FDA at 1-800-FDA-1088. Where should I keep my medication? This medication is given in a hospital or clinic. It will not be stored at home. NOTE: This sheet is a summary. It may not cover all possible information. If you have questions about this medicine, talk to your doctor, pharmacist, or health care provider.  2024 Elsevier/Gold Standard (2021-04-22 00:00:00)

## 2024-01-25 ENCOUNTER — Telehealth: Payer: Self-pay | Admitting: Hematology and Oncology

## 2024-01-25 NOTE — Telephone Encounter (Signed)
 left vm for daughter of Amy Lucas to call back and schedule f/u appts per 11/13 los
